# Patient Record
Sex: Male | Born: 1975 | Race: White | Hispanic: No | Smoking: Current every day smoker
Health system: Southern US, Community
[De-identification: ages and names within clinical notes are randomized; demographics above are authoritative.]

## PROBLEM LIST (undated history)

## (undated) DIAGNOSIS — G8929 Other chronic pain: Secondary | ICD-10-CM

## (undated) DIAGNOSIS — F410 Panic disorder [episodic paroxysmal anxiety] without agoraphobia: Secondary | ICD-10-CM

## (undated) DIAGNOSIS — M549 Dorsalgia, unspecified: Secondary | ICD-10-CM

## (undated) DIAGNOSIS — R569 Unspecified convulsions: Secondary | ICD-10-CM

## (undated) SURGERY — VIDEO BRONCHOSCOPY WITHOUT FLUORO
Anesthesia: Moderate Sedation

---

## 2006-03-29 ENCOUNTER — Emergency Department: Payer: Self-pay | Admitting: Emergency Medicine

## 2006-10-19 ENCOUNTER — Emergency Department: Payer: Self-pay

## 2006-11-10 ENCOUNTER — Emergency Department: Payer: Self-pay | Admitting: Emergency Medicine

## 2006-11-10 ENCOUNTER — Other Ambulatory Visit: Payer: Self-pay

## 2007-01-10 ENCOUNTER — Emergency Department: Payer: Self-pay | Admitting: Emergency Medicine

## 2007-07-14 ENCOUNTER — Emergency Department: Payer: Self-pay

## 2007-08-13 ENCOUNTER — Emergency Department: Payer: Self-pay | Admitting: Emergency Medicine

## 2007-12-27 ENCOUNTER — Emergency Department: Payer: Self-pay | Admitting: Emergency Medicine

## 2008-02-11 ENCOUNTER — Other Ambulatory Visit: Payer: Self-pay

## 2008-02-11 ENCOUNTER — Emergency Department: Payer: Self-pay

## 2008-02-15 ENCOUNTER — Other Ambulatory Visit: Payer: Self-pay

## 2008-02-15 ENCOUNTER — Emergency Department: Payer: Self-pay | Admitting: Emergency Medicine

## 2008-03-08 ENCOUNTER — Emergency Department: Payer: Self-pay | Admitting: Emergency Medicine

## 2008-03-09 ENCOUNTER — Other Ambulatory Visit: Payer: Self-pay

## 2011-04-18 ENCOUNTER — Emergency Department: Payer: Self-pay | Admitting: *Deleted

## 2011-08-14 ENCOUNTER — Emergency Department: Payer: Self-pay | Admitting: Emergency Medicine

## 2011-10-31 ENCOUNTER — Inpatient Hospital Stay: Payer: Self-pay | Admitting: Internal Medicine

## 2012-03-04 ENCOUNTER — Emergency Department: Payer: Self-pay | Admitting: Emergency Medicine

## 2012-03-04 LAB — CBC
HCT: 48.3 % (ref 40.0–52.0)
HGB: 16.2 g/dL (ref 13.0–18.0)
MCH: 27.8 pg (ref 26.0–34.0)
Platelet: 258 10*3/uL (ref 150–440)
RBC: 5.84 10*6/uL (ref 4.40–5.90)
WBC: 7.1 10*3/uL (ref 3.8–10.6)

## 2012-03-04 LAB — COMPREHENSIVE METABOLIC PANEL
Albumin: 3.8 g/dL (ref 3.4–5.0)
Anion Gap: 13 (ref 7–16)
Bilirubin,Total: 0.3 mg/dL (ref 0.2–1.0)
Glucose: 96 mg/dL (ref 65–99)
Osmolality: 281 (ref 275–301)
Potassium: 3.9 mmol/L (ref 3.5–5.1)
SGOT(AST): 29 U/L (ref 15–37)
Sodium: 142 mmol/L (ref 136–145)
Total Protein: 7.6 g/dL (ref 6.4–8.2)

## 2012-03-04 LAB — TROPONIN I: Troponin-I: <0.02 ng/mL

## 2012-03-06 ENCOUNTER — Emergency Department: Payer: Self-pay | Admitting: Emergency Medicine

## 2012-03-06 LAB — URINALYSIS, COMPLETE
Bacteria: NONE SEEN
Blood: NEGATIVE
Nitrite: NEGATIVE
Ph: 6 (ref 4.5–8.0)
Specific Gravity: 1.018 (ref 1.003–1.030)
Squamous Epithelial: NONE SEEN
WBC UR: 1 /HPF (ref 0–5)

## 2012-03-06 LAB — COMPREHENSIVE METABOLIC PANEL
Albumin: 3.4 g/dL (ref 3.4–5.0)
Alkaline Phosphatase: 83 U/L (ref 50–136)
Bilirubin,Total: 0.7 mg/dL (ref 0.2–1.0)
Calcium, Total: 8.5 mg/dL (ref 8.5–10.1)
Chloride: 101 mmol/L (ref 98–107)
EGFR (African American): >60
Osmolality: 276 (ref 275–301)
Potassium: 4.4 mmol/L (ref 3.5–5.1)
Sodium: 139 mmol/L (ref 136–145)
Total Protein: 7.5 g/dL (ref 6.4–8.2)

## 2012-03-06 LAB — CBC
HCT: 50 % (ref 40.0–52.0)
MCHC: 33.1 g/dL (ref 32.0–36.0)
Platelet: 218 10*3/uL (ref 150–440)
RBC: 6.02 10*6/uL — ABNORMAL HIGH (ref 4.40–5.90)
RDW: 15.8 % — ABNORMAL HIGH (ref 11.5–14.5)
WBC: 14.2 10*3/uL — ABNORMAL HIGH (ref 3.8–10.6)

## 2012-03-06 LAB — TSH: Thyroid Stimulating Horm: 0.54 u[IU]/mL

## 2012-03-06 LAB — PROTIME-INR
INR: 1
Prothrombin Time: 13.7 secs (ref 11.5–14.7)

## 2012-03-06 LAB — RAPID INFLUENZA A&B ANTIGENS

## 2012-03-06 LAB — TROPONIN I: Troponin-I: <0.02 ng/mL

## 2012-03-12 LAB — CULTURE, BLOOD (SINGLE)

## 2012-03-15 ENCOUNTER — Inpatient Hospital Stay: Payer: Self-pay | Admitting: Psychiatry

## 2012-03-15 LAB — URINALYSIS, COMPLETE
Bacteria: NONE SEEN
Bilirubin,UR: NEGATIVE
Glucose,UR: NEGATIVE mg/dL (ref 0–75)
Ketone: NEGATIVE
Leukocyte Esterase: NEGATIVE
Ph: 7 (ref 4.5–8.0)
RBC,UR: NONE SEEN /HPF (ref 0–5)
Squamous Epithelial: NONE SEEN
WBC UR: <1 /HPF (ref 0–5)

## 2012-03-15 LAB — COMPREHENSIVE METABOLIC PANEL
Alkaline Phosphatase: 83 U/L (ref 50–136)
Anion Gap: 13 (ref 7–16)
BUN: 9 mg/dL (ref 7–18)
Bilirubin,Total: 0.4 mg/dL (ref 0.2–1.0)
Calcium, Total: 9.3 mg/dL (ref 8.5–10.1)
Chloride: 106 mmol/L (ref 98–107)
Co2: 23 mmol/L (ref 21–32)
EGFR (Non-African Amer.): >60
Osmolality: 281 (ref 275–301)
Potassium: 4 mmol/L (ref 3.5–5.1)
SGOT(AST): 68 U/L — ABNORMAL HIGH (ref 15–37)
Total Protein: 8.1 g/dL (ref 6.4–8.2)

## 2012-03-15 LAB — CBC
MCV: 83 fL (ref 80–100)
Platelet: 239 10*3/uL (ref 150–440)
RDW: 15.2 % — ABNORMAL HIGH (ref 11.5–14.5)
WBC: 7.7 10*3/uL (ref 3.8–10.6)

## 2012-03-15 LAB — DRUG SCREEN, URINE
Barbiturates, Ur Screen: NEGATIVE (ref ?–200)
Cannabinoid 50 Ng, Ur ~~LOC~~: NEGATIVE (ref ?–50)
Cocaine Metabolite,Ur ~~LOC~~: NEGATIVE (ref ?–300)
Methadone, Ur Screen: NEGATIVE (ref ?–300)
Opiate, Ur Screen: NEGATIVE (ref ?–300)
Phencyclidine (PCP) Ur S: NEGATIVE (ref ?–25)
Tricyclic, Ur Screen: NEGATIVE (ref ?–1000)

## 2012-03-15 LAB — ACETAMINOPHEN LEVEL: Acetaminophen: <2 ug/mL

## 2012-03-15 LAB — TSH: Thyroid Stimulating Horm: 0.68 u[IU]/mL

## 2012-05-09 ENCOUNTER — Emergency Department: Payer: Self-pay | Admitting: *Deleted

## 2012-06-13 ENCOUNTER — Emergency Department: Payer: Self-pay | Admitting: Emergency Medicine

## 2012-06-13 LAB — BASIC METABOLIC PANEL WITH GFR
Anion Gap: 9
BUN: 8 mg/dL
Calcium, Total: 9 mg/dL
Chloride: 106 mmol/L
Co2: 26 mmol/L
Creatinine: 0.95 mg/dL
EGFR (African American): >60
EGFR (Non-African Amer.): >60
Glucose: 105 mg/dL — ABNORMAL HIGH
Osmolality: 280
Potassium: 3.2 mmol/L — ABNORMAL LOW
Sodium: 141 mmol/L

## 2012-06-13 LAB — URINALYSIS, COMPLETE
Bacteria: NONE SEEN
Glucose,UR: 50 mg/dL (ref 0–75)
Granular Cast: 28
Nitrite: NEGATIVE
Protein: 100
RBC,UR: <1 /HPF (ref 0–5)
Specific Gravity: 1.015 (ref 1.003–1.030)
Squamous Epithelial: NONE SEEN
WBC UR: <1 /HPF (ref 0–5)

## 2012-06-13 LAB — CK: CK, Total: 241 U/L — ABNORMAL HIGH (ref 35–232)

## 2012-06-13 LAB — DRUG SCREEN, URINE
Amphetamines, Ur Screen: NEGATIVE
Barbiturates, Ur Screen: NEGATIVE
Benzodiazepine, Ur Scrn: POSITIVE
Cannabinoid 50 Ng, Ur ~~LOC~~: NEGATIVE
Cocaine Metabolite,Ur ~~LOC~~: NEGATIVE
MDMA (Ecstasy)Ur Screen: NEGATIVE
Methadone, Ur Screen: NEGATIVE
Opiate, Ur Screen: NEGATIVE
Phencyclidine (PCP) Ur S: NEGATIVE
Tricyclic, Ur Screen: NEGATIVE

## 2012-06-13 LAB — CBC WITH DIFFERENTIAL/PLATELET
Eosinophil #: 0 10*3/uL (ref 0.0–0.7)
Eosinophil %: 0.1 %
HGB: 14.4 g/dL (ref 13.0–18.0)
Lymphocyte %: 7.1 %
MCH: 28.8 pg (ref 26.0–34.0)
MCHC: 33.6 g/dL (ref 32.0–36.0)
MCV: 86 fL (ref 80–100)
Monocyte #: 1 x10 3/mm (ref 0.2–1.0)
Neutrophil %: 86.1 %
Platelet: 247 10*3/uL (ref 150–440)
RDW: 17.2 % — ABNORMAL HIGH (ref 11.5–14.5)
WBC: 15.2 10*3/uL — ABNORMAL HIGH (ref 3.8–10.6)

## 2012-06-13 LAB — ETHANOL
Ethanol %: <0.003 %
Ethanol: <3 mg/dL

## 2012-06-14 LAB — CBC WITH DIFFERENTIAL/PLATELET
Basophil #: 0 10*3/uL (ref 0.0–0.1)
Basophil %: 0.3 %
Eosinophil %: 0.1 %
HGB: 13.9 g/dL (ref 13.0–18.0)
Lymphocyte #: 1.8 10*3/uL (ref 1.0–3.6)
Lymphocyte %: 14.8 %
MCH: 28.2 pg (ref 26.0–34.0)
MCV: 86 fL (ref 80–100)
Neutrophil #: 9.6 10*3/uL — ABNORMAL HIGH (ref 1.4–6.5)
Neutrophil %: 78.4 %
Platelet: 236 10*3/uL (ref 150–440)
RDW: 17.5 % — ABNORMAL HIGH (ref 11.5–14.5)

## 2012-09-06 ENCOUNTER — Emergency Department: Payer: Self-pay | Admitting: Emergency Medicine

## 2013-07-29 ENCOUNTER — Ambulatory Visit (INDEPENDENT_AMBULATORY_CARE_PROVIDER_SITE_OTHER): Payer: BC Managed Care – PPO | Admitting: Emergency Medicine

## 2013-07-29 ENCOUNTER — Ambulatory Visit: Payer: BC Managed Care – PPO

## 2013-07-29 VITALS — BP 118/72 | HR 90 | Temp 98.2°F | Resp 17 | Ht 69.5 in | Wt 174.0 lb

## 2013-07-29 DIAGNOSIS — M4120 Other idiopathic scoliosis, site unspecified: Secondary | ICD-10-CM

## 2013-07-29 DIAGNOSIS — M545 Low back pain: Secondary | ICD-10-CM

## 2013-07-29 DIAGNOSIS — M419 Scoliosis, unspecified: Secondary | ICD-10-CM

## 2013-07-29 DIAGNOSIS — M549 Dorsalgia, unspecified: Secondary | ICD-10-CM

## 2013-07-29 DIAGNOSIS — M546 Pain in thoracic spine: Secondary | ICD-10-CM

## 2013-07-29 LAB — POCT URINALYSIS DIPSTICK
Bilirubin, UA: NEGATIVE
Blood, UA: NEGATIVE
Glucose, UA: NEGATIVE
Ketones, UA: NEGATIVE
Leukocytes, UA: NEGATIVE
Nitrite, UA: NEGATIVE
pH, UA: 7.5

## 2013-07-29 MED ORDER — OXYCODONE-ACETAMINOPHEN 5-325 MG PO TABS: 1.0000 | ORAL_TABLET | Freq: Three times a day (TID) | ORAL | Status: DC | PRN

## 2013-07-29 MED ORDER — CYCLOBENZAPRINE HCL 10 MG PO TABS: ORAL_TABLET | ORAL | Status: DC

## 2013-07-29 NOTE — Patient Instructions (Signed)
Please recheck in 48-72 hours to be sure you are improving. He should hear from our office regarding the scheduling of an MRI of the T-spine and LS-spine
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
## 2013-07-30 ENCOUNTER — Ambulatory Visit (HOSPITAL_COMMUNITY)
Admission: RE | Admit: 2013-07-30 | Discharge: 2013-07-30 | Disposition: A | Discharge status: Home / Self Care | Payer: BC Managed Care – PPO | Source: Ambulatory Visit | Attending: Emergency Medicine | Admitting: Emergency Medicine

## 2013-07-30 ENCOUNTER — Other Ambulatory Visit: Payer: Self-pay | Admitting: Emergency Medicine

## 2013-07-30 DIAGNOSIS — M5126 Other intervertebral disc displacement, lumbar region: Secondary | ICD-10-CM

## 2013-07-30 DIAGNOSIS — M549 Dorsalgia, unspecified: Secondary | ICD-10-CM | POA: Insufficient documentation

## 2013-07-30 DIAGNOSIS — M5124 Other intervertebral disc displacement, thoracic region: Secondary | ICD-10-CM

## 2013-07-30 DIAGNOSIS — M4120 Other idiopathic scoliosis, site unspecified: Secondary | ICD-10-CM | POA: Insufficient documentation

## 2013-07-31 ENCOUNTER — Ambulatory Visit (INDEPENDENT_AMBULATORY_CARE_PROVIDER_SITE_OTHER): Payer: BC Managed Care – PPO | Admitting: Emergency Medicine

## 2013-07-31 VITALS — BP 142/92 | HR 86 | Temp 98.0°F | Resp 16 | Ht 70.0 in | Wt 180.0 lb

## 2013-07-31 DIAGNOSIS — M546 Pain in thoracic spine: Secondary | ICD-10-CM

## 2013-07-31 DIAGNOSIS — M545 Low back pain: Secondary | ICD-10-CM

## 2013-07-31 MED ORDER — OXYCODONE-ACETAMINOPHEN 10-325 MG PO TABS: 1.0000 | ORAL_TABLET | Freq: Four times a day (QID) | ORAL | Status: DC | PRN
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
## 2013-07-31 NOTE — Patient Instructions (Signed)
You should be contacted by our office the first of the week regarding followup with an orthopedist in [place] so they can continue your management of your back injury.

## 2013-08-03 ENCOUNTER — Other Ambulatory Visit: Payer: Self-pay

## 2013-08-03 ENCOUNTER — Encounter: Payer: Self-pay | Admitting: Radiology

## 2013-08-06 ENCOUNTER — Ambulatory Visit (INDEPENDENT_AMBULATORY_CARE_PROVIDER_SITE_OTHER): Payer: BC Managed Care – PPO | Admitting: Emergency Medicine

## 2013-08-06 ENCOUNTER — Ambulatory Visit: Payer: BC Managed Care – PPO

## 2013-08-06 ENCOUNTER — Telehealth: Payer: Self-pay

## 2013-08-06 VITALS — BP 130/78 | HR 93 | Temp 98.3°F | Resp 18 | Ht 70.5 in | Wt 174.6 lb

## 2013-08-06 DIAGNOSIS — M545 Low back pain: Secondary | ICD-10-CM

## 2013-08-06 DIAGNOSIS — M542 Cervicalgia: Secondary | ICD-10-CM

## 2013-08-06 DIAGNOSIS — M546 Pain in thoracic spine: Secondary | ICD-10-CM

## 2013-08-06 MED ORDER — OXYCODONE-ACETAMINOPHEN 10-325 MG PO TABS: 1.0000 | ORAL_TABLET | Freq: Four times a day (QID) | ORAL | Status: DC | PRN

## 2013-08-06 MED ORDER — CYCLOBENZAPRINE HCL 10 MG PO TABS: ORAL_TABLET | ORAL | Status: DC

## 2013-08-06 NOTE — Telephone Encounter (Signed)
Patient forgot to get note from Dr Cleta Alberts about being out of work and how long Dr Cleta Alberts wants him out. Should he be out until he sees the ortho?

## 2013-08-06 NOTE — Telephone Encounter (Signed)
Yes patient needs this day out of work until he is seen by the orthopedist. He needs to be sure and get the note from me before I leave to go at a town. It is fine to give him a note out of work until his orthopedic evaluation.

## 2013-08-06 NOTE — Patient Instructions (Addendum)
You should be contacted by our workers compensation department on Monday or Tuesday regarding your appointment with the orthopedist

## 2013-08-06 NOTE — Progress Notes (Signed)
  Subjective:    Patient ID: Paulla Fore, male    DOB: 04-02-1976, 37 y.o.   MRN: [id number]  HPI 37 y.o male presents to clinic today for recheck of back pain. Appointment with orthopedics is in 2 weeks.   Taking flexeril at night. Denies any numbness in legs or trouble urinating. He still has significant pain it primarily in his mid thoracic area but also down in the lower LS-spine area. He denies radicular symptoms down his right leg.   Review of Systems     Objective:   Physical Exam patient appears very uncomfortable. He has to sit upright braced in a chair. Examination of the neck reveals no cervical spine tenderness there is no pericervical tenderness and he has good rotation of the neck. There is significant tenderness over the midthoracic spine and also significant tenderness in the L5-S1 area. Deep tendon reflexes biceps and brachial radialis are 2+ knees and ankles are 2+. He has positive straight leg raising at 75 both legs.  [facility] reading (PRIMARY) by  Dr.Dorraine Ellender there is an minimal C5-C6  degenerative disc disease no acute changes        Assessment & Plan:    Meds were refilled. Referral made to worker's comp for patient be seen for a lumbar thoracic strain.

## 2013-08-07 ENCOUNTER — Telehealth: Payer: Self-pay

## 2013-08-07 ENCOUNTER — Encounter: Payer: Self-pay | Admitting: *Deleted

## 2013-08-07 NOTE — Telephone Encounter (Signed)
Created note- pt aware and will pick up Sunday

## 2013-08-07 NOTE — Telephone Encounter (Signed)
Pt was here yesterday and saw Daub, could not sleep last night, had muscle spasms and cramps on the right side.  Would like a muscle relaxer or something called in if possible.  Call back [phone].

## 2013-08-08 NOTE — Telephone Encounter (Signed)
Pt states that he has been taking the flexeril only at night but he still cannot sleep.  Is there anything we can do to help.

## 2013-08-09 MED ORDER — METAXALONE 800 MG PO TABS: 800.0000 mg | ORAL_TABLET | Freq: Three times a day (TID) | ORAL | Status: DC

## 2013-08-09 NOTE — Telephone Encounter (Signed)
Called patient to advise this is sent in for him. Advised also the work note is at front desk.

## 2013-08-09 NOTE — Telephone Encounter (Signed)
We can try and give him Skelaxin at night to see if he can sleep. We can call in the 800 mg tablets to take one at bedtime. He cannot take this with the Flexeril. Call-in #20 tablets no refill

## 2013-09-22 ENCOUNTER — Emergency Department (HOSPITAL_COMMUNITY)
Admission: EM | Admit: 2013-09-22 | Discharge: 2013-09-22 | Disposition: A | Discharge status: Home / Self Care | Payer: BC Managed Care – PPO | Attending: Emergency Medicine | Admitting: Emergency Medicine

## 2013-09-22 ENCOUNTER — Encounter (HOSPITAL_COMMUNITY): Payer: Self-pay | Admitting: Emergency Medicine

## 2013-09-22 ENCOUNTER — Emergency Department (HOSPITAL_COMMUNITY): Payer: BC Managed Care – PPO

## 2013-09-22 DIAGNOSIS — J159 Unspecified bacterial pneumonia: Secondary | ICD-10-CM | POA: Insufficient documentation

## 2013-09-22 DIAGNOSIS — I10 Essential (primary) hypertension: Secondary | ICD-10-CM | POA: Insufficient documentation

## 2013-09-22 DIAGNOSIS — F17200 Nicotine dependence, unspecified, uncomplicated: Secondary | ICD-10-CM | POA: Insufficient documentation

## 2013-09-22 DIAGNOSIS — G8929 Other chronic pain: Secondary | ICD-10-CM | POA: Insufficient documentation

## 2013-09-22 DIAGNOSIS — J189 Pneumonia, unspecified organism: Secondary | ICD-10-CM

## 2013-09-22 DIAGNOSIS — F410 Panic disorder [episodic paroxysmal anxiety] without agoraphobia: Secondary | ICD-10-CM | POA: Insufficient documentation

## 2013-09-22 DIAGNOSIS — R112 Nausea with vomiting, unspecified: Secondary | ICD-10-CM | POA: Insufficient documentation

## 2013-09-22 DIAGNOSIS — R040 Epistaxis: Secondary | ICD-10-CM | POA: Insufficient documentation

## 2013-09-22 DIAGNOSIS — R51 Headache: Secondary | ICD-10-CM | POA: Insufficient documentation

## 2013-09-22 DIAGNOSIS — J029 Acute pharyngitis, unspecified: Secondary | ICD-10-CM | POA: Insufficient documentation

## 2013-09-22 DIAGNOSIS — Z79899 Other long term (current) drug therapy: Secondary | ICD-10-CM | POA: Insufficient documentation

## 2013-09-22 HISTORY — DX: Unspecified convulsions: R56.9

## 2013-09-22 HISTORY — DX: Panic disorder (episodic paroxysmal anxiety): F41.0

## 2013-09-22 HISTORY — DX: Other chronic pain: G89.29

## 2013-09-22 HISTORY — DX: Dorsalgia, unspecified: M54.9

## 2013-09-22 LAB — CBC WITH DIFFERENTIAL/PLATELET
Basophils Absolute: 0 K/uL (ref 0.0–0.1)
Basophils Relative: 0 % (ref 0–1)
Eosinophils Absolute: 0.2 K/uL (ref 0.0–0.7)
Eosinophils Relative: 2 % (ref 0–5)
HCT: 41.5 % (ref 39.0–52.0)
Hemoglobin: 15 g/dL (ref 13.0–17.0)
Lymphocytes Relative: 20 % (ref 12–46)
Lymphs Abs: 1.9 K/uL (ref 0.7–4.0)
MCH: 30.1 pg (ref 26.0–34.0)
MCHC: 36.1 g/dL — ABNORMAL HIGH (ref 30.0–36.0)
MCV: 83.2 fL (ref 78.0–100.0)
Monocytes Absolute: 0.9 K/uL (ref 0.1–1.0)
Monocytes Relative: 10 % (ref 3–12)
Neutro Abs: 6.4 K/uL (ref 1.7–7.7)
Neutrophils Relative %: 68 % (ref 43–77)
Platelets: 163 K/uL (ref 150–400)
RBC: 4.99 MIL/uL (ref 4.22–5.81)
RDW: 13.8 % (ref 11.5–15.5)
WBC: 9.4 K/uL (ref 4.0–10.5)

## 2013-09-22 LAB — COMPREHENSIVE METABOLIC PANEL WITH GFR
ALT: 12 U/L (ref 0–53)
AST: 18 U/L (ref 0–37)
Albumin: 4.3 g/dL (ref 3.5–5.2)
Alkaline Phosphatase: 75 U/L (ref 39–117)
BUN: 4 mg/dL — ABNORMAL LOW (ref 6–23)
CO2: 26 meq/L (ref 19–32)
Calcium: 8.8 mg/dL (ref 8.4–10.5)
Chloride: 105 meq/L (ref 96–112)
Creatinine, Ser: 0.82 mg/dL (ref 0.50–1.35)
GFR calc Af Amer: >90 mL/min
GFR calc non Af Amer: >90 mL/min
Glucose, Bld: 62 mg/dL — ABNORMAL LOW (ref 70–99)
Potassium: 3.3 meq/L — ABNORMAL LOW (ref 3.5–5.1)
Sodium: 142 meq/L (ref 135–145)
Total Bilirubin: 0.3 mg/dL (ref 0.3–1.2)
Total Protein: 6.3 g/dL (ref 6.0–8.3)

## 2013-09-22 LAB — LIPASE, BLOOD: Lipase: 71 U/L — ABNORMAL HIGH (ref 11–59)

## 2013-09-22 MED ORDER — SODIUM CHLORIDE 0.9 % IV BOLUS (SEPSIS)
1000.0000 mL | Freq: Once | INTRAVENOUS | Status: AC
  Administered 2013-09-22: 1000 mL via INTRAVENOUS

## 2013-09-22 MED ORDER — HYDROCOD POLST-CHLORPHEN POLST 10-8 MG/5ML PO LQCR
5.0000 mL | Freq: Once | ORAL | Status: AC
  Administered 2013-09-22: 5 mL via ORAL
  Filled 2013-09-22: qty 5

## 2013-09-22 MED ORDER — HYDROCOD POLST-CHLORPHEN POLST 10-8 MG/5ML PO LQCR: 5.0000 mL | Freq: Two times a day (BID) | ORAL | Status: DC | PRN

## 2013-09-22 MED ORDER — ONDANSETRON HCL 4 MG/2ML IJ SOLN
4.0000 mg | Freq: Once | INTRAMUSCULAR | Status: AC
  Administered 2013-09-22: 4 mg via INTRAVENOUS
  Filled 2013-09-22: qty 2

## 2013-09-22 MED ORDER — LEVOFLOXACIN 750 MG PO TABS: 750.0000 mg | ORAL_TABLET | Freq: Every day | ORAL | Status: DC

## 2013-09-22 MED ORDER — LEVOFLOXACIN 750 MG PO TABS
750.0000 mg | ORAL_TABLET | Freq: Once | ORAL | Status: AC
  Administered 2013-09-22: 750 mg via ORAL
  Filled 2013-09-22: qty 1

## 2013-09-22 NOTE — ED Notes (Signed)
Pt. reports epistaxis onset this evening , fever yesterday , seen at an urgent care yesterday prescribed with Amoxicillin antibiotic .

## 2013-09-22 NOTE — ED Provider Notes (Signed)
CSN: [id number]     Arrival date & time 09/22/13  0136 History   First MD Initiated Contact with Patient 09/22/13 0141     Chief Complaint  Patient presents with  . Epistaxis  . Fever   (Consider location/radiation/quality/duration/timing/severity/associated sxs/prior Treatment) HPI Comments: Patient presents to the ER for evaluation of multiple complaints. Patient reports that he started having a low-grade fever yesterday. He also awakened with a sore throat. He was seen by his primary care doctor and had a negative rapid strep. He was started on amoxicillin and Robitussin. Patient reports that this evening after starting the medications, he had onset of nausea, vomiting. He says it felt like his throat was swollen he had trouble pushing. She reports a "funny feeling" on the left side of his head.after he vomited he had onset of nosebleed. Wife reports that there was "a lot of blood".  Patient is a 37 y.o. male presenting with nosebleeds and fever.  Epistaxis Associated symptoms: fever, headaches and sore throat   Fever Associated symptoms: headaches, nausea, sore throat and vomiting     Past Medical History  Diagnosis Date  . Hypertension   . Seizures   . Panic attack   . Chronic back pain    History reviewed. No pertinent past surgical history. No family history on file. History  Substance Use Topics  . Smoking status: Current Every Day Smoker -- 1.00 packs/day for 14 years    Types: Cigarettes  . Smokeless tobacco: Not on file  . Alcohol Use: No    Review of Systems  Constitutional: Positive for fever.  HENT: Positive for nosebleeds and sore throat.   Respiratory: Positive for shortness of breath.   Gastrointestinal: Positive for nausea and vomiting.  Neurological: Positive for headaches.  All other systems reviewed and are negative.    Allergies  Review of patient's allergies indicates no known allergies.  Home Medications   Current Outpatient Rx  Name  Route   Sig  Dispense  Refill  . acetaminophen (TYLENOL) 500 MG tablet   Oral   Take 500 mg by mouth every 6 (six) hours as needed for pain.         Marland Kitchen ALPRAZolam (XANAX) 1 MG tablet   Oral   Take 1 mg by mouth at bedtime as needed for sleep.         Marland Kitchen amoxicillin (AMOXIL) 875 MG tablet   Oral   Take 875 mg by mouth 2 (two) times daily. For ten days         . guaiFENesin-codeine (ROBITUSSIN AC) 100-10 MG/5ML syrup   Oral   Take 5 mLs by mouth at bedtime.         . naproxen sodium (ANAPROX) 220 MG tablet   Oral   Take 440 mg by mouth as needed (fever).         . traZODone (DESYREL) 100 MG tablet   Oral   Take 200 mg by mouth at bedtime.          BP 123/81  Pulse 83  Temp(Src) 98.1 F (36.7 C) (Oral)  Resp 10  SpO2 98% Physical Exam  Constitutional: He is oriented to person, place, and time. He appears well-developed and well-nourished. No distress.  HENT:  Head: Normocephalic and atraumatic.  Right Ear: Hearing normal.  Left Ear: Hearing normal.  Nose: Nose normal.  Mouth/Throat: Oropharynx is clear and moist and mucous membranes are normal.  Eyes: Conjunctivae and EOM are normal. Pupils are equal, round,  and reactive to light.  Neck: Normal range of motion. Neck supple.  Cardiovascular: Regular rhythm, S1 normal and S2 normal.  Exam reveals no gallop and no friction rub.   No murmur heard. Pulmonary/Chest: Effort normal and breath sounds normal. No respiratory distress. He exhibits no tenderness.  Abdominal: Soft. Normal appearance and bowel sounds are normal. There is no hepatosplenomegaly. There is no tenderness. There is no rebound, no guarding, no tenderness at McBurney's point and negative Murphy's sign. No hernia.  Musculoskeletal: Normal range of motion.  Neurological: He is alert and oriented to person, place, and time. He has normal strength. No cranial nerve deficit or sensory deficit. Coordination normal. GCS eye subscore is 4. GCS verbal subscore is 5.  GCS motor subscore is 6.  Skin: Skin is warm, dry and intact. No rash noted. No cyanosis.  Psychiatric: He has a normal mood and affect. His speech is normal and behavior is normal. Thought content normal.    ED Course  Procedures (including critical care time) Labs Review Labs Reviewed  CBC WITH DIFFERENTIAL - Abnormal; Notable for the following:    MCHC 36.1 (*)    All other components within normal limits  COMPREHENSIVE METABOLIC PANEL  LIPASE, BLOOD  URINALYSIS, ROUTINE W REFLEX MICROSCOPIC   Imaging Review Dg Chest 2 View  09/22/2013   CLINICAL DATA:  Shortness of breath and fever  EXAM: CHEST  2 VIEW  COMPARISON:  None.  FINDINGS: Very small ill-defined nodular density in the peripheral left upper chest, approximately 2 cm. No lobar consolidation. Biapical pleural parenchymal scarring which is symmetric. No cardiomegaly. No effusion or pneumothorax.  IMPRESSION: Small opacity in the peripheral left lung, which may represent an early infectious infiltrate given the history. Recommend followup examination after treatment to ensure clearing.   Electronically Signed   By: Tiburcio Pea   On: 09/22/2013 02:33    MDM  Diagnosis: Community acquired pneumonia  Patient has been sick since yesterday with fever, sore throat, congestion and cough. Blood work was unremarkable. He had an episode of epistaxis earlier, platelets are normal as is hemoglobin. No current bleeding. Chest x-ray does show early pneumonia which explains the patient's fever and cough. Will be switched from amoxicillin to Levaquin.    Gilda Crease, MD 09/22/13 [phone]

## 2013-09-22 NOTE — ED Notes (Signed)
Pt was seen at [facility] yesterday with c/o of sore throat.  Pt given Amoxicillin and Cheratussin Rx.  Pt now presents with c/o continued throat pain, N/V, headache, fever, and nasal bleeding.  Pt currently afebrile, states he took Acetaminophen approximately 2 hours ago.

## 2013-09-27 ENCOUNTER — Ambulatory Visit (INDEPENDENT_AMBULATORY_CARE_PROVIDER_SITE_OTHER): Payer: BC Managed Care – PPO | Admitting: Family Medicine

## 2013-09-27 ENCOUNTER — Ambulatory Visit: Payer: BC Managed Care – PPO

## 2013-09-27 VITALS — BP 160/92 | HR 86 | Temp 98.2°F | Resp 16 | Ht 70.5 in | Wt 163.0 lb

## 2013-09-27 DIAGNOSIS — Z720 Tobacco use: Secondary | ICD-10-CM

## 2013-09-27 DIAGNOSIS — J189 Pneumonia, unspecified organism: Secondary | ICD-10-CM

## 2013-09-27 LAB — POCT CBC
Hemoglobin: 14.8 g/dL (ref 14.1–18.1)
Lymph, poc: 3 (ref 0.6–3.4)
MCHC: 31.6 g/dL — AB (ref 31.8–35.4)
MID (cbc): 1.2 — AB (ref 0–0.9)
MPV: 10.1 fL (ref 0–99.8)
POC Granulocyte: 4.5 (ref 2–6.9)
POC LYMPH PERCENT: 34.4 %L (ref 10–50)
POC MID %: 14.2 %M — AB (ref 0–12)
Platelet Count, POC: 148 10*3/uL (ref 142–424)
RDW, POC: 15.7 %

## 2013-09-27 MED ORDER — ALBUTEROL SULFATE (2.5 MG/3ML) 0.083% IN NEBU
2.5000 mg | INHALATION_SOLUTION | Freq: Once | RESPIRATORY_TRACT | Status: AC
  Administered 2013-09-27: 2.5 mg via RESPIRATORY_TRACT

## 2013-09-27 MED ORDER — IPRATROPIUM BROMIDE 0.02 % IN SOLN
0.5000 mg | Freq: Once | RESPIRATORY_TRACT | Status: AC
  Administered 2013-09-27: 0.5 mg via RESPIRATORY_TRACT

## 2013-09-27 MED ORDER — HYDROCOD POLST-CHLORPHEN POLST 10-8 MG/5ML PO LQCR: 5.0000 mL | Freq: Two times a day (BID) | ORAL | Status: DC | PRN

## 2013-09-27 MED ORDER — PREDNISONE 20 MG PO TABS: 40.0000 mg | ORAL_TABLET | Freq: Every day | ORAL | Status: DC

## 2013-09-27 MED ORDER — IPRATROPIUM-ALBUTEROL 20-100 MCG/ACT IN AERS: 1.0000 | INHALATION_SPRAY | Freq: Four times a day (QID) | RESPIRATORY_TRACT | Status: DC | PRN
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
## 2013-09-28 ENCOUNTER — Telehealth: Payer: Self-pay

## 2013-09-28 MED ORDER — IPRATROPIUM-ALBUTEROL 20-100 MCG/ACT IN AERS: 1.0000 | INHALATION_SPRAY | Freq: Four times a day (QID) | RESPIRATORY_TRACT | Status: DC | PRN

## 2013-09-28 NOTE — Telephone Encounter (Signed)
Called again they did not get the combivent, it was resent. Dr Clelia Croft, I sent to you in error, you do not need to do anything with this.

## 2013-09-28 NOTE — Telephone Encounter (Signed)
Called pharmacy, to make sure they got combivent inhaler, left message to call me back.

## 2013-09-28 NOTE — Telephone Encounter (Signed)
Patient called to say that his pharmacy at [facility] received all medications except for albuterol. - inhaler.    [phone]  Pharmacy: [id number] - [place], [place] - [address] AT [facility] OF [address]

## 2013-10-07 ENCOUNTER — Ambulatory Visit: Payer: Self-pay

## 2013-10-07 ENCOUNTER — Ambulatory Visit: Payer: BC Managed Care – PPO

## 2013-10-07 ENCOUNTER — Ambulatory Visit (INDEPENDENT_AMBULATORY_CARE_PROVIDER_SITE_OTHER): Payer: BC Managed Care – PPO | Admitting: Emergency Medicine

## 2013-10-07 VITALS — BP 104/72 | HR 112 | Temp 99.1°F | Resp 18 | Ht 69.5 in | Wt 156.8 lb

## 2013-10-07 DIAGNOSIS — M542 Cervicalgia: Secondary | ICD-10-CM

## 2013-10-07 DIAGNOSIS — W19XXXA Unspecified fall, initial encounter: Secondary | ICD-10-CM

## 2013-10-07 DIAGNOSIS — M549 Dorsalgia, unspecified: Secondary | ICD-10-CM

## 2013-10-07 MED ORDER — OXYCODONE-ACETAMINOPHEN 5-325 MG PO TABS: 1.0000 | ORAL_TABLET | Freq: Three times a day (TID) | ORAL | Status: DC | PRN

## 2013-10-07 NOTE — Progress Notes (Addendum)
[address]   [place], [place]  [address]   [phone] Subjective:    Patient ID: Alexis West, male    DOB: 1976-09-29, 37 y.o.   MRN: [id number]  This chart was scribed for Lesle Chris, MD by Blanchard Kelch, ED Scribe. The patient was seen in room 13. Patient's care was started at 2:21 PM.   HPI  Alexis West is a 37 y.o. male who presents to office due to back pain that began after he fell off a roof yesterday afternoon about 5 PM. He was cleaning the gutters and slipped on the corner of the roof. He fell about 11-12 feet and landed on his upper back on the grass. He denies hitting his head or loss of consciousness. He currently has pain to his mid upper back and neck.  He is also complaining of left leg pain but this was baseline before the accident. He has a history of chronic lower back pain. He was seen by an orthopedist for the chronic pain. He has been to physical therapy and has since returned to work about three weeks ago. He works as a Civil engineer, contracting. He was recently diagnosed with pneumonia and was prescribed antibiotics for it at [facility].     Past Medical History  Diagnosis Date  . Hypertension   . Seizures   . Panic attack   . Chronic back pain   History reviewed. No pertinent past surgical history. History reviewed. No pertinent family history. History   Social History  . Marital Status: Legally Separated    Spouse Name: N/A    Number of Children: N/A  . Years of Education: N/A   Occupational History  . Not on file.   Social History Main Topics  . Smoking status: Current Every Day Smoker -- 1.00 packs/day for 14 years    Types: Cigarettes  . Smokeless tobacco: Not on file  . Alcohol Use: No  . Drug Use: Not on file  . Sexual Activity: Not on file   Other Topics Concern  . Not on file   Social History Narrative  . No narrative on file   No Known Allergies Current Outpatient Prescriptions on File Prior to Visit    Medication Sig Dispense Refill  . acetaminophen (TYLENOL) 500 MG tablet Take 500 mg by mouth every 6 (six) hours as needed for pain.      Marland Kitchen ALPRAZolam (XANAX) 1 MG tablet Take 1 mg by mouth at bedtime as needed for sleep.      . Ipratropium-Albuterol (COMBIVENT) 20-100 MCG/ACT AERS respimat Inhale 1 puff into the lungs every 6 (six) hours as needed for wheezing or shortness of breath.  1 Inhaler  1  . naproxen sodium (ANAPROX) 220 MG tablet Take 440 mg by mouth as needed (fever).      . predniSONE (DELTASONE) 20 MG tablet Take 2 tablets (40 mg total) by mouth daily.  10 tablet  0  . traZODone (DESYREL) 100 MG tablet Take 200 mg by mouth at bedtime.      . chlorpheniramine-HYDROcodone (TUSSIONEX PENNKINETIC ER) 10-8 MG/5ML LQCR Take 5 mLs by mouth every 12 (twelve) hours as needed.  120 mL  0  . levofloxacin (LEVAQUIN) 750 MG tablet Take 1 tablet (750 mg total) by mouth daily. X 7 days  7 tablet  0   No current facility-administered medications on file prior to visit.      Review of Systems  Musculoskeletal: Positive for  back pain and neck pain.  Neurological: Negative for syncope.       Objective:   Physical Exam  Musculoskeletal: He exhibits tenderness.       Right knee: Normal.       Left knee: Normal.  Tenderness mid T spine. No lower back tenderness.    Neurological:  Reflex Scores:      Brachioradialis reflexes are 2+ on the right side and 2+ on the left side.      Patellar reflexes are 2+ on the right side and 2+ on the left side. Bicep reflexes are trace.          Assessment & Plan:   Orders Placed This Encounter  Procedures  . DG Chest 2 View    Standing Status: Future     Number of Occurrences:      Standing Expiration Date: 12/07/2014    Order Specific Question:  Reason for Exam (SYMPTOM  OR DIAGNOSIS REQUIRED)    Answer:  Neck pain, back pain    Order Specific Question:  Preferred imaging location?    Answer:  External  . DG Cervical Spine Complete     Standing Status: Future     Number of Occurrences:      Standing Expiration Date: 12/07/2014    Order Specific Question:  Reason for Exam (SYMPTOM  OR DIAGNOSIS REQUIRED)    Answer:  Neck pain    Order Specific Question:  Preferred imaging location?    Answer:  External  . DG Thoracic Spine 2 View    Standing Status: Future     Number of Occurrences:      Standing Expiration Date: 12/07/2014    Order Specific Question:  Reason for Exam (SYMPTOM  OR DIAGNOSIS REQUIRED)    Answer:  back pain    Order Specific Question:  Preferred imaging location?    Answer:  External   Problem List Items Addressed This Visit   None    Visit Diagnoses   Neck pain    -  Primary    Relevant Orders       DG Chest 2 View       DG Cervical Spine Complete       DG Thoracic Spine 2 View    Back pain        Relevant Orders       DG Chest 2 View       DG Cervical Spine Complete       DG Thoracic Spine 2 View    Fall, initial encounter        Relevant Orders       DG Chest 2 View       DG Cervical Spine Complete       DG Thoracic Spine 2 View      [facility] reading (PRIMARY) by  Dr.Kimberely Mccannon is C5-C6 degenerative disc disease but no acute injury to the C-spine. T-spine films show questionable irregularity at T7 on the AP view. Please comment. Chest x-ray appears normal.      I personally performed the services described in this documentation, which was scribed in my presence. The recorded information has been reviewed and is accurate.  Spoke with the radiologist. He does not see any acute injury to the C-spine, lungs, or T-spine. He does have a mild scoliosis of the thoracic spine but did not see signs of a compression fracture. We'll treat with pain medications for the present time recheck on Sunday.

## 2013-10-10 ENCOUNTER — Ambulatory Visit (INDEPENDENT_AMBULATORY_CARE_PROVIDER_SITE_OTHER): Payer: BC Managed Care – PPO | Admitting: Emergency Medicine

## 2013-10-10 VITALS — BP 118/82 | HR 108 | Temp 98.9°F | Resp 16

## 2013-10-10 DIAGNOSIS — M542 Cervicalgia: Secondary | ICD-10-CM

## 2013-10-10 DIAGNOSIS — M549 Dorsalgia, unspecified: Secondary | ICD-10-CM

## 2013-10-10 MED ORDER — OXYCODONE-ACETAMINOPHEN 10-325 MG PO TABS: 1.0000 | ORAL_TABLET | Freq: Four times a day (QID) | ORAL | Status: DC | PRN

## 2013-10-10 MED ORDER — METAXALONE 800 MG PO TABS: ORAL_TABLET | ORAL | Status: DC

## 2013-10-10 NOTE — Progress Notes (Signed)
Subjective:  This chart was scribed for Collene Gobble, MD by Arlan Organ, ED Scribe. This patient was seen in room Room 10 and the patient's care was started 1:18 PM.    Patient ID: Alexis West, male    DOB: 1976-02-05, 37 y.o.   MRN: [id number]  HPI HPI Comments: Alexis West is a 37 y.o. Male with a hx of chronic back pain who presents to [facility] seeking a follow up today from his visit on 10/9 for his back and neck pain. Pt states his pain has worsened, and he has not been able to sleep due to the discomfort. He states the prescribed medications from his last visit has not given him any relief. Pt denies any hx of surgeries.  Pt was seen at [facility] but is requesting to not go back to that facility. Pt states they did not do anything to help him. He says they wanted to hold him out of work for 12 more weeks, and pt requested for them to sign him out.  Pt states he is currently back to work, and has been back for 4-5 weeks.  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 Pt states he has been with this company for about 10 months.  Review of Systems  Musculoskeletal: Positive for back pain (lower back pain).  Neurological: Negative for numbness.    Past Medical History  Diagnosis Date  . Hypertension   . Seizures   . Panic attack   . Chronic back pain     History   Social History  . Marital Status: Legally Separated    Spouse Name: N/A    Number of Children: N/A  . Years of Education: N/A   Occupational History  . Not on file.   Social History Main Topics  . Smoking status: Current Every Day Smoker -- 1.00 packs/day for 14 years    Types: Cigarettes  . Smokeless tobacco: Not on file  . Alcohol Use: No  . Drug Use: Not on file  . Sexual Activity: Not on file   Other Topics Concern  . Not on file   Social History Narrative  . No narrative on file    History reviewed. No pertinent past surgical history.    Results for orders placed in visit on 09/27/13  POCT CBC      Result Value Range   WBC 8.7  4.6 - 10.2 K/uL   Lymph, poc 3.0  0.6 - 3.4   POC LYMPH PERCENT 34.4  10 - 50 %L   MID (cbc) 1.2 (*) 0 - 0.9   POC MID % 14.2 (*) 0 - 12 %M   POC Granulocyte 4.5  2 - 6.9   Granulocyte percent 51.4  37 - 80 %G   RBC 5.05  4.69 - 6.13 M/uL   Hemoglobin 14.8  14.1 - 18.1 g/dL   HCT, POC 40.9  81.1 - 53.7 %   MCV 92.9  80 - 97 fL   MCH, POC 29.3  27 - 31.2 pg   MCHC 31.6 (*) 31.8 - 35.4 g/dL   RDW, POC 91.4     Platelet Count, POC 148  142 - 424 K/uL   MPV 10.1  0 - 99.8 fL       Objective:   Physical Exam  Constitutional: He is oriented to person, place, and time. He appears well-developed and well-nourished. No distress.  HENT:  Head:  Normocephalic.  Eyes: Conjunctivae are normal. Pupils are equal, round, and reactive to light. No scleral icterus.  Neck: Normal range of motion. Neck supple. No thyromegaly present.  Cardiovascular: Normal rate and regular rhythm.  Exam reveals no gallop and no friction rub.   No murmur heard. Pulmonary/Chest: Effort normal and breath sounds normal. No respiratory distress. He has no wheezes. He has no rales.  Abdominal: Soft. Bowel sounds are normal. He exhibits no distension. There is no tenderness. There is no rebound.  Musculoskeletal: He exhibits tenderness.  Tenderness to palpation over posterior cervical area Markedly decreased ROM in both directions and flexion and extension No focal upper extremity weakness Positive for straight leg raise at 75 degrees Unable to turn head past 15 degrees to the right  Neurological: He is alert and oriented to person, place, and time.  Skin: Skin is warm and dry. No rash noted.  Psychiatric: He has a normal mood and affect. His behavior is normal.   Assessment & Plan:  Meds refill referral made to Dr. Yevette Edwards for evaluation

## 2013-10-15 ENCOUNTER — Ambulatory Visit (INDEPENDENT_AMBULATORY_CARE_PROVIDER_SITE_OTHER): Payer: BC Managed Care – PPO | Admitting: Emergency Medicine

## 2013-10-15 VITALS — BP 122/74 | HR 88 | Temp 97.4°F | Resp 17 | Ht 70.5 in | Wt 158.0 lb

## 2013-10-15 DIAGNOSIS — M542 Cervicalgia: Secondary | ICD-10-CM

## 2013-10-15 MED ORDER — OXYCODONE-ACETAMINOPHEN 10-325 MG PO TABS: 1.0000 | ORAL_TABLET | Freq: Four times a day (QID) | ORAL | Status: DC | PRN

## 2013-10-15 NOTE — Progress Notes (Signed)
This chart was scribed for Lesle Chris, MD by Joaquin Music, ED Scribe. This patient was seen in room Room 1 and the patient's care was started at 3:14 PM  Subjective:    Patient ID: Alexis West, male    DOB: 02-08-1976, 37 y.o.   MRN: [id number]  HPI Alexis West is a 37 y.o. male who presents to the [facility] complaining of F/U appointment. Pt states he has been taking his muscle relaxer as prescribed. Pt states he worked Monday and has not had to work for the 4 days that have followed. Pt states he is still having complications of with his neck and lower back with associated pain. He rates his current pain as an 8/10 and states once he takes his medication his pain is a 3/10. Pt has limited ROM. He states the bruising from his digits has faded.   Pt has an appointment with Dr. Merian Capron Tuesday Oct 19, 2013. Pt has been taking his pain medications as prescribed.    Review of Systems  Musculoskeletal: Positive for back pain and neck pain.       Objective:   Physical Exam patient has exquisite tenderness on the right side of the neck. He is limited range of motion to the right and limited rotation of approximately 30. He has much better rotation to the left. He has limited flexion and extension of the neck. His deep tendon reflexes of the upper extremities are 2+ and symmetrical motor strength 5 out of 5. The left knee with reflexes 1+ right knee 2+.        Assessment & Plan:  Patient here with persistent severe neck pain following a fall. He has an appointment to see Dr. Delrae Alfred on Monday. We'll go ahead and schedule an MRI of the C-spine. I refilled his pain medications and Dr.Dumonski can take over his treatment after Monday

## 2013-10-22 ENCOUNTER — Ambulatory Visit (INDEPENDENT_AMBULATORY_CARE_PROVIDER_SITE_OTHER): Payer: BC Managed Care – PPO | Admitting: Internal Medicine

## 2013-10-22 ENCOUNTER — Ambulatory Visit: Payer: BC Managed Care – PPO

## 2013-10-22 VITALS — BP 130/72 | HR 81 | Temp 98.2°F | Resp 18 | Ht 70.5 in | Wt 160.6 lb

## 2013-10-22 DIAGNOSIS — F17200 Nicotine dependence, unspecified, uncomplicated: Secondary | ICD-10-CM | POA: Insufficient documentation

## 2013-10-22 DIAGNOSIS — R509 Fever, unspecified: Secondary | ICD-10-CM

## 2013-10-22 DIAGNOSIS — J329 Chronic sinusitis, unspecified: Secondary | ICD-10-CM

## 2013-10-22 DIAGNOSIS — F411 Generalized anxiety disorder: Secondary | ICD-10-CM | POA: Insufficient documentation

## 2013-10-22 DIAGNOSIS — R05 Cough: Secondary | ICD-10-CM

## 2013-10-22 LAB — POCT CBC
Hemoglobin: 15.4 g/dL (ref 14.1–18.1)
Lymph, poc: 2.7 (ref 0.6–3.4)
MCH, POC: 30.6 pg (ref 27–31.2)
MCHC: 32.1 g/dL (ref 31.8–35.4)
MCV: 95.2 fL (ref 80–97)
MID (cbc): 0.8 (ref 0–0.9)
RBC: 5.04 M/uL (ref 4.69–6.13)
WBC: 7.4 10*3/uL (ref 4.6–10.2)

## 2013-10-22 MED ORDER — AMOXICILLIN 500 MG PO CAPS: 1000.0000 mg | ORAL_CAPSULE | Freq: Two times a day (BID) | ORAL | Status: AC

## 2013-10-22 MED ORDER — HYDROCODONE-HOMATROPINE 5-1.5 MG/5ML PO SYRP: 5.0000 mL | ORAL_SOLUTION | Freq: Four times a day (QID) | ORAL | Status: DC | PRN

## 2013-10-22 NOTE — Progress Notes (Deleted)
  Subjective:    Patient ID: Alexis West, male    DOB: [birth year], 37 y.o.   MRN: [id number]  HPI    Review of Systems     Objective:   Physical Exam        Assessment & Plan:

## 2013-10-22 NOTE — Progress Notes (Signed)
Subjective:    Patient ID: Alexis West, male    DOB: December 03, 1976, 37 y.o.   MRN: [id number]  HPI Patient presents today with worsening productive cough. Fever to 102 in the evenings. Taking ibuprofen for fever. Patient was seen about 9/24 in the ER and was prescribed Levaquin. He presented to [facility] 5 days into Levaquin course with worsening fever and cough. CXR was improved and he was given a 5 day course of prednisone. He is currently producing brown-green sputum. Cough is keeping him up at night. Intermittent nose bleeds. Some green nasal drainage. SOB with activity. No wheezing. No headaches. No nausea or vomiting. Appetite decreased. Weight loss 3 pounds in last month. Taking Mucinex without improvement of cough. Using combivent.   Review of Systems  Constitutional: Positive for fever, appetite change and unexpected weight change.  HENT: Positive for congestion, nosebleeds, postnasal drip and rhinorrhea. Negative for sinus pressure and sore throat.   Respiratory: Positive for cough and shortness of breath. Negative for wheezing.   Cardiovascular: Negative for chest pain.  Neurological: Negative for headaches.       Objective:   Physical Exam  Nursing note and vitals reviewed. Constitutional: He is oriented to person, place, and time. He appears well-developed and well-nourished. No distress.  HENT:  Right Ear: Tympanic membrane, external ear and ear canal normal.  Left Ear: External ear and ear canal normal. Tympanic membrane is bulging.  Eyes: Pupils are equal, round, and reactive to light. Right eye exhibits no discharge. Left eye exhibits no discharge.  Neck: Normal range of motion. Neck supple.  Right anterior cervical node, tender, mobile.  Cardiovascular: Normal rate and normal heart sounds.   Pulmonary/Chest: Effort normal and breath sounds normal. No respiratory distress. He has no wheezes. He has no rales.  Nonproductive cough observed.  Lymphadenopathy:    He has  cervical adenopathy.  Neurological: He is alert and oriented to person, place, and time.  Skin: Skin is warm and dry. He is not diaphoretic.  Psychiatric: He has a normal mood and affect. His behavior is normal. Judgment and thought content normal.   Results for orders placed in visit on 10/22/13  POCT CBC      Result Value Range   WBC 7.4  4.6 - 10.2 K/uL   Lymph, poc 2.7  0.6 - 3.4   POC LYMPH PERCENT 36.0  10 - 50 %L   MID (cbc) 0.8  0 - 0.9   POC MID % 10.2  0 - 12 %M   POC Granulocyte 4.0  2 - 6.9   Granulocyte percent 53.8  37 - 80 %G   RBC 5.04  4.69 - 6.13 M/uL   Hemoglobin 15.4  14.1 - 18.1 g/dL   HCT, POC 40.9  81.1 - 53.7 %   MCV 95.2  80 - 97 fL   MCH, POC 30.6  27 - 31.2 pg   MCHC 32.1  31.8 - 35.4 g/dL   RDW, POC 91.4     Platelet Count, POC 152  142 - 424 K/uL   MPV 12.5  0 - 99.8 fL     [facility] reading (PRIMARY) by  Dr. Bryer Gottsch=NAD chest      Assessment & Plan:  Cough - Plan: POCT CBC, DG Chest 2 View  Fever - Plan: POCT CBC, DG Chest 2 View  Sinusitis  Meds ordered this encounter  Medications  . amoxicillin (AMOXIL) 500 MG capsule    Sig: Take 2 capsules (1,000 mg total)  by mouth 2 (two) times daily.    Dispense:  40 capsule    Refill:  0  . HYDROcodone-homatropine (HYCODAN) 5-1.5 MG/5ML syrup    Sig: Take 5 mLs by mouth every 6 (six) hours as needed for cough.    Dispense:  120 mL    Refill:  0   1- Meds as above. Increase fluids. Return if no improvement with treatment.  I have reviewed and agree with documentation By FNP Leone Payor of our encounter  Harrel Lemon. Merla Riches, M.D.

## 2013-10-26 ENCOUNTER — Encounter (HOSPITAL_COMMUNITY): Payer: Self-pay | Admitting: Emergency Medicine

## 2013-10-26 ENCOUNTER — Emergency Department (HOSPITAL_COMMUNITY)
Admission: EM | Admit: 2013-10-26 | Discharge: 2013-10-26 | Disposition: A | Discharge status: Home / Self Care | Payer: BC Managed Care – PPO | Attending: Emergency Medicine | Admitting: Emergency Medicine

## 2013-10-26 ENCOUNTER — Emergency Department (HOSPITAL_COMMUNITY): Payer: BC Managed Care – PPO

## 2013-10-26 DIAGNOSIS — Z792 Long term (current) use of antibiotics: Secondary | ICD-10-CM | POA: Insufficient documentation

## 2013-10-26 DIAGNOSIS — F17200 Nicotine dependence, unspecified, uncomplicated: Secondary | ICD-10-CM | POA: Insufficient documentation

## 2013-10-26 DIAGNOSIS — Z79899 Other long term (current) drug therapy: Secondary | ICD-10-CM | POA: Insufficient documentation

## 2013-10-26 DIAGNOSIS — Z8669 Personal history of other diseases of the nervous system and sense organs: Secondary | ICD-10-CM | POA: Insufficient documentation

## 2013-10-26 DIAGNOSIS — F410 Panic disorder [episodic paroxysmal anxiety] without agoraphobia: Secondary | ICD-10-CM | POA: Insufficient documentation

## 2013-10-26 DIAGNOSIS — M549 Dorsalgia, unspecified: Secondary | ICD-10-CM | POA: Insufficient documentation

## 2013-10-26 DIAGNOSIS — G8929 Other chronic pain: Secondary | ICD-10-CM | POA: Insufficient documentation

## 2013-10-26 DIAGNOSIS — R040 Epistaxis: Secondary | ICD-10-CM

## 2013-10-26 DIAGNOSIS — I10 Essential (primary) hypertension: Secondary | ICD-10-CM | POA: Insufficient documentation

## 2013-10-26 LAB — COMPREHENSIVE METABOLIC PANEL
ALT: 10 U/L (ref 0–53)
AST: 19 U/L (ref 0–37)
BUN: 9 mg/dL (ref 6–23)
CO2: 28 mEq/L (ref 19–32)
Calcium: 9.9 mg/dL (ref 8.4–10.5)
Creatinine, Ser: 0.81 mg/dL (ref 0.50–1.35)
GFR calc Af Amer: >90 mL/min (ref >90)
GFR calc non Af Amer: >90 mL/min (ref >90)
Glucose, Bld: 84 mg/dL (ref 70–99)
Potassium: 4 mEq/L (ref 3.5–5.1)
Total Bilirubin: 0.7 mg/dL (ref 0.3–1.2)

## 2013-10-26 LAB — RAPID URINE DRUG SCREEN, HOSP PERFORMED
Barbiturates: NOT DETECTED
Benzodiazepines: NOT DETECTED
Cocaine: NOT DETECTED
Opiates: NOT DETECTED
Tetrahydrocannabinol: NOT DETECTED

## 2013-10-26 LAB — URINALYSIS, ROUTINE W REFLEX MICROSCOPIC
Bilirubin Urine: NEGATIVE
Hgb urine dipstick: NEGATIVE
Nitrite: NEGATIVE
Specific Gravity, Urine: 1.004 — ABNORMAL LOW (ref 1.005–1.030)
pH: 8 (ref 5.0–8.0)

## 2013-10-26 LAB — CBC WITH DIFFERENTIAL/PLATELET
Basophils Relative: 0 % (ref 0–1)
Eosinophils Absolute: 0.1 10*3/uL (ref 0.0–0.7)
Eosinophils Relative: 1 % (ref 0–5)
Lymphs Abs: 1.7 10*3/uL (ref 0.7–4.0)
MCH: 30.5 pg (ref 26.0–34.0)
MCHC: 35.5 g/dL (ref 30.0–36.0)
MCV: 86 fL (ref 78.0–100.0)
Monocytes Absolute: 0.5 10*3/uL (ref 0.1–1.0)
Neutrophils Relative %: 72 % (ref 43–77)
Platelets: 196 10*3/uL (ref 150–400)
RBC: 5.64 MIL/uL (ref 4.22–5.81)

## 2013-10-26 MED ORDER — SODIUM CHLORIDE 0.9 % IV SOLN
Freq: Once | INTRAVENOUS | Status: AC
  Administered 2013-10-26: 12:00:00 via INTRAVENOUS

## 2013-10-26 MED ORDER — OXYMETAZOLINE HCL 0.05 % NA SOLN: NASAL | Status: DC

## 2013-10-26 MED ORDER — ONDANSETRON HCL 4 MG PO TABS: ORAL_TABLET | ORAL | Status: DC

## 2013-10-26 NOTE — ED Notes (Signed)
Pt brought to ED by EMS with nose bleed.Pt says he was exposed to ammonia last week.

## 2013-10-26 NOTE — ED Provider Notes (Signed)
CSN: [id number]     Arrival date & time 10/26/13  1114 History   First MD Initiated Contact with Patient 10/26/13 1114     Chief Complaint  Patient presents with  . Epistaxis   (Consider location/radiation/quality/duration/timing/severity/associated sxs/prior Treatment) HPI  Alexis West Is a 37 year old male with a past medical history of community-acquired pneumonia diagnosed one month ago.  Patient states that since that time he has been treated with antibiotics.  He has had daily nosebleeds up to 7 times a day.  He states that they're usually resolved with pressure.  Patient also complains of headaches, intermittent syncope, fatigue and weakness.  The patient has also had intermittent headache.  He has a history of neck pain denies stiff neck, rashes.  Patient's wife states that last night he also was febrile.  Today the patient was at work and he developed a nosebleed, weakness, nausea and vomiting.  The patient was brought into the emergency department for evaluation patient has had close outpatient followup with recent repeat chest x-ray 4 days ago thaT is stable pleural thickening.  Patient is currently taking Amoxil.. view of the patient's labs shows no down pending thrombocytes.  And a stable hemoglobin.  The patient denies any current fever, change since stools, chest pain, shortness of breath, rash. She denies any visual changes, unilateral weakness, difficulty with speech or swallowing.  Past Medical History  Diagnosis Date  . Hypertension   . Seizures   . Panic attack   . Chronic back pain    History reviewed. No pertinent past surgical history. No family history on file. History  Substance Use Topics  . Smoking status: Current Every Day Smoker -- 1.00 packs/day for 14 years    Types: Cigarettes  . Smokeless tobacco: Not on file  . Alcohol Use: No    Review of Systems Ten systems reviewed and are negative for acute change, except as noted in the HPI.   Allergies   Morphine and related  Home Medications   Current Outpatient Rx  Name  Route  Sig  Dispense  Refill  . acetaminophen (TYLENOL) 500 MG tablet   Oral   Take 1,000 mg by mouth every 6 (six) hours as needed for pain.          Marland Kitchen albuterol (PROVENTIL HFA;VENTOLIN HFA) 108 (90 BASE) MCG/ACT inhaler   Inhalation   Inhale 2 puffs into the lungs every 6 (six) hours as needed for wheezing.         Marland Kitchen ALPRAZolam (XANAX) 1 MG tablet   Oral   Take 1 mg by mouth at bedtime as needed for sleep.         . naproxen sodium (ANAPROX) 220 MG tablet   Oral   Take 440 mg by mouth as needed (fever).         . traZODone (DESYREL) 100 MG tablet   Oral   Take 200 mg by mouth at bedtime.         Marland Kitchen amoxicillin (AMOXIL) 500 MG capsule   Oral   Take 2 capsules (1,000 mg total) by mouth 2 (two) times daily.   40 capsule   0   . HYDROcodone-homatropine (HYCODAN) 5-1.5 MG/5ML syrup   Oral   Take 5 mLs by mouth every 6 (six) hours as needed for cough.   120 mL   0    BP 112/66  Pulse 91  Temp(Src) 98.8 F (37.1 C) (Oral)  Resp 18  SpO2 100% Physical Exam Physical  Exam  Nursing note and vitals reviewed. Constitutional: Patient in no acute distress HENT:  Head: Normocephalic and atraumatic.  Eyes: Conjunctivae normal are normal. No scleral icterus. MYDRIATIC PUPILS. Funduscopic exam reveals no papilledema, no AV nicking or hemorrhage. Neck: Normal range of motion. Neck supple.  Cardiovascular: Normal rate, regular rhythm and normal heart sounds.   Pulmonary/Chest: Effort normal and breath sounds normal. No respiratory distress.  Abdominal: Soft. There is no tenderness.  Musculoskeletal: He exhibits no edema.  Neurological: He is alert.   Skin: Skin is warm and dry. He is not diaphoretic.  Psychiatric: His behavior is normal.    ED Course  Procedures (including critical care time) Labs Review Labs Reviewed  URINALYSIS, ROUTINE W REFLEX MICROSCOPIC - Abnormal; Notable for the  following:    Specific Gravity, Urine 1.004 (*)    All other components within normal limits  CBC WITH DIFFERENTIAL - Abnormal; Notable for the following:    Hemoglobin 17.2 (*)    All other components within normal limits  URINE RAPID DRUG SCREEN (HOSP PERFORMED)  COMPREHENSIVE METABOLIC PANEL  LIPASE, BLOOD   Imaging Review Ct Head Wo Contrast  10/26/2013   CLINICAL DATA:  Severe headache with hypertension and nosebleed of uncertain etiology  EXAM: CT HEAD WITHOUT CONTRAST  TECHNIQUE: Contiguous axial images were obtained from the base of the skull through the vertex without intravenous contrast. Study was obtained within 24 hr of patient's arrival at the emergency department.  COMPARISON:  February 09, 2007  FINDINGS: The ventricles are normal in size and configuration. Prominence of the cisterna magna is a stable anatomic variant. There is no mass, hemorrhage, extra-axial fluid collection, or midline shift. The gray-white compartments are normal. There is no demonstrable acute infarct.  Bony calvarium appears intact. The mastoid air cells are clear. There is mild rightward deviation of the nasal septum. There is no lesions seen in the nasal region. The nasal terminates do not appear enlarged.  IMPRESSION: Mild leftward deviation of nasal septum. No mass or terminate edema appreciated in the nasal regions. Study otherwise unremarkable. No intracranial mass, hemorrhage, or acute infarct.   Electronically Signed   By: Bretta Bang M.D.   On: 10/26/2013 15:07    EKG Interpretation   None       MDM   1. Epistaxis, recurrent    Patient with multiple vague sxs of fatigue, HA, syncope, epistaxis. Patient's wife states that he has had change in his personality and mentation and she is concerned. Patient's physical exam is benign and no focal neuro deficits. I have discussed the case with Dr. Rubin Payor and will obtain Head CT for further eval.       4:13 PM BP 112/66  Pulse 91   Temp(Src) 98.8 F (37.1 C) (Oral)  Resp 18  SpO2 100% Patient ct negative. A febrile and stable vitals. Patient has had no vomiting or recurrence of epistaxis while here in the ED. The patient appears reasonably screened and/or stabilized for discharge and I doubt any other medical condition or other [facility] requiring further screening, evaluation, or treatment in the ED at this time prior to discharge. Patient is to follow up with his pcp/ENT regarding continued sxs. I will d/c with afrin nasal spray to utilize for the next 24 hours .    Arthor Captain, PA-C 10/27/13 2105

## 2013-10-28 NOTE — ED Provider Notes (Signed)
Medical screening examination/treatment/procedure(s) were performed by non-physician practitioner and as supervising physician I was immediately available for consultation/collaboration.  EKG Interpretation   None        Mackinzie Vuncannon R. Rikita Grabert, MD 10/28/13 0019 

## 2013-11-08 ENCOUNTER — Ambulatory Visit: Payer: BC Managed Care – PPO

## 2013-11-08 ENCOUNTER — Ambulatory Visit (INDEPENDENT_AMBULATORY_CARE_PROVIDER_SITE_OTHER): Payer: BC Managed Care – PPO | Admitting: Family Medicine

## 2013-11-08 VITALS — BP 110/66 | HR 110 | Temp 98.6°F | Resp 18 | Ht 70.5 in | Wt 157.8 lb

## 2013-11-08 DIAGNOSIS — Z720 Tobacco use: Secondary | ICD-10-CM

## 2013-11-08 DIAGNOSIS — R05 Cough: Secondary | ICD-10-CM

## 2013-11-08 DIAGNOSIS — F17200 Nicotine dependence, unspecified, uncomplicated: Secondary | ICD-10-CM

## 2013-11-08 DIAGNOSIS — R634 Abnormal weight loss: Secondary | ICD-10-CM

## 2013-11-08 MED ORDER — HYDROCOD POLST-CHLORPHEN POLST 10-8 MG/5ML PO LQCR: 5.0000 mL | Freq: Two times a day (BID) | ORAL | Status: DC | PRN

## 2013-11-08 NOTE — Patient Instructions (Signed)
Continue taking the doxycyline.  Drink plenty of fluids (water is best!)  Use the Tussionex for cough.  Please come back in 48-72 hours to have your TB skin test read and your blood drawn.  Come back sooner if any symptoms are worsening

## 2013-11-08 NOTE — Progress Notes (Signed)
Subjective:    Patient ID: Alexis West, male    DOB: 01-Jan-1976, 37 y.o.   MRN: [id number]  HPI   [name] is a 37 yr old male here because "I can't get over this pneumonia."  Reports that he continues to cough "a lot of nasty stuff up," but states this is not as bad as it was.  He occ feels SOB.  Denies wheezing.  Chest and abd muscles are sore from coughing.  Cough is worse at night.  Not using anything currently for cough, previously used Tussionex.  Reports he is still having fevers but they are fewer and further between.  Reports Tmax 100-101F.  Occ night sweats.  Admits to a 35lb weight loss since first dx'd with pneumonia.  (On chart review, pt has lost about 25lbs since Aug 2014.)  He is using albuterol usually twice per day.  He is somewhat of a poor historian, so I am unclear of all of the details of his course.    He completed a course of levaquin initially after dx of CAP at ED on 09/22/13.  Was started on amoxicillin 10/22/13 but reports an allergic reaction to this - vomiting, nose bleeds, passing out.  He reports he was seen at a [facility] about 5-6 days ago, prescribed doxycycline.  Did not start this until yesterday.  Reports that this was prescribed for the pneumonia.    He is a current smoker, but reports he has almost quit.  Only smoking a few cigarettes per day.    No hx of TB or +PPD.  No hx asthma, lung disease.  No known HIV or exposures.  Review of Systems  Constitutional: Positive for fever (intermittent), appetite change (decreased) and unexpected weight change (25lb wt loss).  HENT: Positive for sore throat.   Respiratory: Positive for cough and shortness of breath. Negative for wheezing.   Cardiovascular: Positive for chest pain (muscular, with cough).  Gastrointestinal: Negative.   Musculoskeletal: Negative.   Skin: Negative.   Neurological: Negative.        Objective:   Physical Exam  Vitals reviewed. Constitutional: He is oriented to person, place, and  time. He appears well-developed and well-nourished. No distress.  HENT:  Head: Normocephalic and atraumatic.  Eyes: Conjunctivae are normal. No scleral icterus.  Neck: Neck supple.  Cardiovascular: Normal rate, regular rhythm and normal heart sounds.   Pulmonary/Chest: He has no wheezes. He has no rales.  Poor effort on lung exam; cough with deep insp, otherwise CTA  Abdominal: Soft. There is tenderness (diffusely, due to cough).  Neurological: He is alert and oriented to person, place, and time.  Skin: Skin is warm and dry.  Psychiatric: He has a normal mood and affect. His behavior is normal.      [facility] reading (PRIMARY) by  Dr. Patsy Lager - negative      Assessment & Plan:  Cough - Plan: DG Chest 2 View, TB Skin Test, chlorpheniramine-HYDROcodone (TUSSIONEX PENNKINETIC ER) 10-8 MG/5ML LQCR, HIV antibody, Bordetella Pertussis PCR  Loss of weight - Plan: DG Chest 2 View, TB Skin Test, HIV antibody, Bordetella Pertussis PCR  Tobacco abuse - Plan: DG Chest 2 View   Alexis West is a 37 yr old male here for persistent cough and low grade fevers after diagnosis of CAP 09/22/13.  Pt has been treated with levaquin, amoxicillin (briefly), and now doxycyline.  Cough is somewhat improved but still present.  Additionally he has lost about 25 lbs in the last 3  months.  CXR is negative today.  PPD placed.  Pt refused bloodwork today because "they'll have to get it out of my finger, I'm not hydrated," and he did not want to wait for the blood draw.  He is agreeable to having labs drawn in 48 hrs when he returns for PPD read.  Future orders placed.  He will hydrate in preparation for this.  Discussed my concern for symptoms - will draw HIV, pertussis in addition to PPD placement.  For now, continue doxy.  I have given him Tussionex for cough.  Continue albuterol q4-6h.  Push fluids, rest.  Will follow up on labs.  Encouraged complete smoking cessation.  May need to consider referral to pulm if not  improving.     Loleta Dicker MHS, PA-C [facility] 11/10/20148:01 PM

## 2013-11-08 NOTE — Progress Notes (Signed)
  Tuberculosis Risk Questionnaire  1. No Were you born outside the [place] in one of the following parts of the world: [place], [place], [place], [place] or [place]?    2. No Have you traveled outside the [place] and lived for more than one month in one of the following parts of the world: [place], [place], [place], [place] or [place]?    3. No Do you have a compromised immune system such as from any of the following conditions:HIV/AIDS, organ or bone marrow transplantation, diabetes, immunosuppressive medicines (e.g. Prednisone, Remicaide), leukemia, lymphoma, cancer of the head or neck, gastrectomy or jejunal bypass, end-stage renal disease (on dialysis), or silicosis?     4. No Have you ever or do you plan on working in: a residential care center, a health care facility, a jail or prison or homeless shelter?    5. No Have you ever: injected illegal drugs, used crack cocaine, lived in a homeless shelter  or been in jail or prison?     6. No Have you ever been exposed to anyone with infectious tuberculosis?    Tuberculosis Symptom Questionnaire  Do you currently have any of the following symptoms?  1. Yes  Unexplained cough lasting more than 3 weeks?   2. Yes  Unexplained fever lasting more than 3 weeks.   3. No Night Sweats (sweating that leaves the bedclothes and sheets wet)     4. Yes  Shortness of Breath   5. Yes  Chest Pain   6. Yes  Unintentional weight loss    7. No Unexplained fatigue (very tired for no reason)

## 2013-11-09 ENCOUNTER — Other Ambulatory Visit: Payer: Self-pay | Admitting: Physician Assistant

## 2013-11-09 ENCOUNTER — Telehealth: Payer: Self-pay

## 2013-11-09 DIAGNOSIS — Z720 Tobacco use: Secondary | ICD-10-CM

## 2013-11-09 DIAGNOSIS — R05 Cough: Secondary | ICD-10-CM

## 2013-11-09 DIAGNOSIS — R634 Abnormal weight loss: Secondary | ICD-10-CM

## 2013-11-09 NOTE — Telephone Encounter (Signed)
 Imaging called with results for the CXR.  Please review report

## 2013-11-09 NOTE — Progress Notes (Signed)
Per radiology recommendation, will order non-con chest CT to further evaluate pt's symptoms

## 2013-11-10 ENCOUNTER — Encounter: Payer: Self-pay | Admitting: Radiology

## 2013-11-10 NOTE — Telephone Encounter (Signed)
Called pt to discuss xray results.  No VM set up, unable to leave message.  Message has been sent to clinical and rad pools to continue attempting to contact pt

## 2013-11-15 ENCOUNTER — Encounter: Payer: Self-pay | Admitting: Radiology

## 2013-11-17 ENCOUNTER — Encounter: Payer: Self-pay | Admitting: Radiology

## 2013-11-17 ENCOUNTER — Other Ambulatory Visit: Payer: BC Managed Care – PPO

## 2013-11-17 ENCOUNTER — Ambulatory Visit (HOSPITAL_COMMUNITY)
Admission: RE | Admit: 2013-11-17 | Discharge: 2013-11-17 | Disposition: A | Discharge status: Home / Self Care | Payer: BC Managed Care – PPO | Source: Ambulatory Visit | Attending: Physician Assistant | Admitting: Physician Assistant

## 2013-11-17 ENCOUNTER — Ambulatory Visit (INDEPENDENT_AMBULATORY_CARE_PROVIDER_SITE_OTHER): Payer: BC Managed Care – PPO | Admitting: Family Medicine

## 2013-11-17 VITALS — BP 132/78 | HR 101 | Temp 98.9°F | Resp 17 | Ht 70.5 in | Wt 156.0 lb

## 2013-11-17 DIAGNOSIS — R634 Abnormal weight loss: Secondary | ICD-10-CM

## 2013-11-17 DIAGNOSIS — R05 Cough: Secondary | ICD-10-CM

## 2013-11-17 DIAGNOSIS — R059 Cough, unspecified: Secondary | ICD-10-CM | POA: Insufficient documentation

## 2013-11-17 DIAGNOSIS — R079 Chest pain, unspecified: Secondary | ICD-10-CM

## 2013-11-17 DIAGNOSIS — J984 Other disorders of lung: Secondary | ICD-10-CM | POA: Insufficient documentation

## 2013-11-17 DIAGNOSIS — R918 Other nonspecific abnormal finding of lung field: Secondary | ICD-10-CM | POA: Insufficient documentation

## 2013-11-17 DIAGNOSIS — Z720 Tobacco use: Secondary | ICD-10-CM

## 2013-11-17 DIAGNOSIS — E041 Nontoxic single thyroid nodule: Secondary | ICD-10-CM

## 2013-11-17 DIAGNOSIS — R911 Solitary pulmonary nodule: Secondary | ICD-10-CM

## 2013-11-17 LAB — COMPREHENSIVE METABOLIC PANEL
AST: 14 U/L (ref 0–37)
Albumin: 4.6 g/dL (ref 3.5–5.2)
BUN: 8 mg/dL (ref 6–23)
CO2: 30 mEq/L (ref 19–32)
Calcium: 9.6 mg/dL (ref 8.4–10.5)
Chloride: 104 mEq/L (ref 96–112)
Creat: 0.8 mg/dL (ref 0.50–1.35)
Glucose, Bld: 76 mg/dL (ref 70–99)

## 2013-11-17 LAB — POCT CBC
HCT, POC: 44.2 % (ref 43.5–53.7)
Hemoglobin: 13.8 g/dL — AB (ref 14.1–18.1)
Lymph, poc: 2.4 (ref 0.6–3.4)
MCH, POC: 29.4 pg (ref 27–31.2)
MCHC: 31.2 g/dL — AB (ref 31.8–35.4)
MCV: 94 fL (ref 80–97)
MID (cbc): 0.5 (ref 0–0.9)
POC LYMPH PERCENT: 31.5 %L (ref 10–50)
Platelet Count, POC: 194 10*3/uL (ref 142–424)
RBC: 4.7 M/uL (ref 4.69–6.13)
RDW, POC: 14.6 %
WBC: 7.5 10*3/uL (ref 4.6–10.2)

## 2013-11-17 LAB — TSH: TSH: 1.164 u[IU]/mL (ref 0.350–4.500)

## 2013-11-17 MED ORDER — HYDROCOD POLST-CHLORPHEN POLST 10-8 MG/5ML PO LQCR: 5.0000 mL | Freq: Two times a day (BID) | ORAL | Status: DC | PRN

## 2013-11-17 NOTE — Patient Instructions (Signed)
Please go to [facility] now for your CT scan.  You can ask for directions to the radiology department and let them know you are there for a CT scan.  You do not have to be seen in the ER    I will call you this evening with your CT results.  Please keep your phone with you and consider setting up your voice mail

## 2013-11-17 NOTE — Progress Notes (Signed)
[facility] [address], [place] [address] [phone]  Date:  11/17/2013   Name:  Alexis West   DOB:  Apr 20, 1976   MRN:  [id number]  PCP:  Lucilla Edin, MD    Chief Complaint: Chest Pain   History of Present Illness:  Alexis West is a 37 y.o. very pleasant male patient who presents with the following:  He was here just over a week ago with cough for several weeks and weight loss.  He was supposed to come back and have labs drawn but did not come back until today  He states he came back today due to persistent cough.  He is going to have a CT but does not know when this will be yet.  Admits he has had his phone off for several days at a time and does not have a VM account set up.   He states that he did not return to have his PPD read because it "went away and nothing showed back up" so he figured it was negative.    He has been coughing up blood on occasion, and last coughed up blood earlier today.   He also notes that his chest "feels real tight." this has gone on for about 2 months.  He felt that it was worse today- maybe due to the cold.  He will occasionally wheeze. "the cough syrup is helping me more than anything  Wt Readings from Last 3 Encounters:  11/17/13 156 lb (70.761 kg)  11/08/13 157 lb 12.8 oz (71.578 kg)  10/22/13 160 lb 9.6 oz (72.848 kg)   He states he has lost 80 lbs over the last year.    Patient Active Problem List   Diagnosis Date Noted  . Smoker 10/22/2013  . Anxiety state, unspecified 10/22/2013    Past Medical History  Diagnosis Date  . Hypertension   . Seizures   . Panic attack   . Chronic back pain     No past surgical history on file.  History  Substance Use Topics  . Smoking status: Current Every Day Smoker -- 1.00 packs/day for 14 years    Types: Cigarettes  . Smokeless tobacco: Not on file  . Alcohol Use: No    No family history on file.  Allergies  Allergen Reactions  . Morphine And  Related     blisters  . Penicillins     Nausea, vomiting, nose bleeds    Medication list has been reviewed and updated.  Current Outpatient Prescriptions on File Prior to Visit  Medication Sig Dispense Refill  . acetaminophen (TYLENOL) 500 MG tablet Take 1,000 mg by mouth every 6 (six) hours as needed for pain.       Marland Kitchen albuterol (PROVENTIL HFA;VENTOLIN HFA) 108 (90 BASE) MCG/ACT inhaler Inhale 2 puffs into the lungs every 6 (six) hours as needed for wheezing.      Marland Kitchen ALPRAZolam (XANAX) 1 MG tablet Take 1 mg by mouth at bedtime as needed for sleep.      . chlorpheniramine-HYDROcodone (TUSSIONEX PENNKINETIC ER) 10-8 MG/5ML LQCR Take 5 mLs by mouth every 12 (twelve) hours as needed for cough (cough).  115 mL  0  . doxycycline (DORYX) 100 MG EC tablet Take 100 mg by mouth 2 (two) times daily.      Marland Kitchen HYDROcodone-homatropine (HYCODAN) 5-1.5 MG/5ML syrup Take 5 mLs by mouth every 6 (six) hours as needed for cough.  120 mL  0  .  naproxen sodium (ANAPROX) 220 MG tablet Take 440 mg by mouth as needed (fever).      . ondansetron (ZOFRAN) 4 MG tablet Take 1 tablet (4 mg total) by mouth every 6 (six) hours for nausea.  12 tablet  0  . oxymetazoline (AFRIN NASAL SPRAY) 0.05 % nasal spray Place 2 sprays into the nose 2 (two) times daily for 2 days and discontinue.  30 mL  0  . traZODone (DESYREL) 100 MG tablet Take 200 mg by mouth at bedtime.       No current facility-administered medications on file prior to visit.    Review of Systems:  As per HPI- otherwise negative.   Physical Examination: Filed Vitals:   11/17/13 1406  BP: 132/78  Pulse: 101  Temp: 98.9 F (37.2 C)  Resp: 17   Filed Vitals:   11/17/13 1406  Height: 5' 10.5" (1.791 m)  Weight: 156 lb (70.761 kg)   Body mass index is 22.06 kg/(m^2). Ideal Body Weight: Weight in (lb) to have BMI = 25: 176.4  GEN: WDWN, NAD, Non-toxic, A & O x 3 HEENT: Atraumatic, Normocephalic. Neck supple. No masses, No LAD. Ears and Nose: No  external deformity. CV: RRR, No M/G/R. No JVD. No thrill. No extra heart sounds. PULM: CTA B, no wheezes, crackles, rhonchi. No retractions. No resp. distress. No accessory muscle use. ABD: S, NT, ND, +BS. No rebound. No HSM. EXTR: No c/c/e NEURO Normal gait.  PSYCH: Normally interactive. Conversant. Not depressed or anxious appearing.  Calm demeanor.   EKG: NSR, no ST elevation or depression, rate 75 BPM  Results for orders placed in visit on 11/17/13  POCT CBC      Result Value Range   WBC 7.5  4.6 - 10.2 K/uL   Lymph, poc 2.4  0.6 - 3.4   POC LYMPH PERCENT 31.5  10 - 50 %L   MID (cbc) 0.5  0 - 0.9   POC MID % 6.1  0 - 12 %M   POC Granulocyte 4.7  2 - 6.9   Granulocyte percent 62.4  37 - 80 %G   RBC 4.70  4.69 - 6.13 M/uL   Hemoglobin 13.8 (*) 14.1 - 18.1 g/dL   HCT, POC 81.1  91.4 - 53.7 %   MCV 94.0  80 - 97 fL   MCH, POC 29.4  27 - 31.2 pg   MCHC 31.2 (*) 31.8 - 35.4 g/dL   RDW, POC 78.2     Platelet Count, POC 194  142 - 424 K/uL   MPV 10.5  0 - 99.8 fL    Assessment and Plan: Cough - Plan: chlorpheniramine-HYDROcodone (TUSSIONEX PENNKINETIC ER) 10-8 MG/5ML LQCR  Loss of weight - Plan: TSH, HIV antibody, Comprehensive metabolic panel, POCT CBC  Chest pain - Plan: EKG 12-Lead  [name] has had a cough for several months and weight loss.  He had not yet heard about his CT appt due to communication difficulties (would not always be able to answer phone and no VM, no mychart).  Were able to get his Ct done today; will call with result.  Await labs as above.  Suspect Cp is due to cough, EKG is reassuring.    Signed Abbe Amsterdam, MD  CT CHEST WITHOUT CONTRAST  TECHNIQUE:  Multidetector CT imaging of the chest was performed following the  standard protocol without IV contrast.  COMPARISON: Chest radiograph November 08, 2013  FINDINGS:  There is mild symmetric apical pleural thickening and scarring. On  axial slice 11 and coronal slice 59, there is a 6 mm nodular  opacity  in the right apex. No other parenchymal nodular opacity is seen.  Also on axial slice 13, there is a 2 mm nodular opacity in the  anterior segment right upper lobe near the apex.  Elsewhere lungs are clear. There is no edema or consolidation.  The there is no appreciable thoracic adenopathy. There is slight  pericardial thickening anteriorly.  Visualized upper abdominal structures appear normal. There are no  blastic or lytic bone lesions. Thyroid appears normal except for a  single subcentimeter nodule in the right lobe measuring 5 mm.  IMPRESSION:  Mild apical scarring bilaterally. There is a 6 mm nodular opacity  with a nearby 2 mm nodular opacity in the right upper lobe/ apex  region. Followup of this nodular opacity should be based on  Fleischner Society guidelines.  No edema or consolidation.  No adenopathy.  Probable minimal pericardial fluid anteriorly.  Subcentimeter thyroid nodular opacity of questionable clinical  significance.  If the patient is at high risk for bronchogenic carcinoma, follow-up  chest CT at 6-12 months is recommended. If the patient is at low  risk for bronchogenic carcinoma, follow-up chest CT at 12 months is  recommended. This recommendation follows the consensus statement:  Guidelines for Management of Small Pulmonary Nodules Detected on CT  Scans: A Statement from the Fleischner Society as published in  Radiology 2005;237:395-400.   Received CT and spoke with him on evening of 11/19.  Explained that his lungs are likely ok although we do need to do a repeat CT in 6 months.  He also has a thyroid nodule that will need follow-up.  He states he has actually noted some "swelling of my thyroid" and he is quite concerned about this.  Advised that I will call him with his TSH and get a thyroid ultrasound set up for him   addnd for 11/20: called to go over labs.  Let him know that his labs look ok.  He then stated that "I want this thing in my chest  out, if it turns out to be cancer I am going to sue someone."  Stated that I cannot guarantee that anyone is going to want to operate on him, but that I am glad to send him for a surgical evaluation if he would like.  I will also set up a thyroid ultrasound for him   Results for orders placed in visit on 11/17/13  TSH      Result Value Range   TSH 1.164  0.350 - 4.500 uIU/mL  HIV ANTIBODY (ROUTINE TESTING)      Result Value Range   HIV NON REACTIVE  NON REACTIVE  COMPREHENSIVE METABOLIC PANEL      Result Value Range   Sodium 142  135 - 145 mEq/L   Potassium 3.9  3.5 - 5.3 mEq/L   Chloride 104  96 - 112 mEq/L   CO2 30  19 - 32 mEq/L   Glucose, Bld 76  70 - 99 mg/dL   BUN 8  6 - 23 mg/dL   Creat 1.61  0.96 - 0.45 mg/dL   Total Bilirubin 0.4  0.3 - 1.2 mg/dL   Alkaline Phosphatase 76  39 - 117 U/L   AST 14  0 - 37 U/L   ALT 10  0 - 53 U/L   Total Protein 6.3  6.0 - 8.3 g/dL   Albumin 4.6  3.5 - 5.2  g/dL   Calcium 9.6  8.4 - 16.1 mg/dL  POCT CBC      Result Value Range   WBC 7.5  4.6 - 10.2 K/uL   Lymph, poc 2.4  0.6 - 3.4   POC LYMPH PERCENT 31.5  10 - 50 %L   MID (cbc) 0.5  0 - 0.9   POC MID % 6.1  0 - 12 %M   POC Granulocyte 4.7  2 - 6.9   Granulocyte percent 62.4  37 - 80 %G   RBC 4.70  4.69 - 6.13 M/uL   Hemoglobin 13.8 (*) 14.1 - 18.1 g/dL   HCT, POC 09.6  04.5 - 53.7 %   MCV 94.0  80 - 97 fL   MCH, POC 29.4  27 - 31.2 pg   MCHC 31.2 (*) 31.8 - 35.4 g/dL   RDW, POC 40.9     Platelet Count, POC 194  142 - 424 K/uL   MPV 10.5  0 - 99.8 fL

## 2013-11-18 ENCOUNTER — Telehealth: Payer: Self-pay

## 2013-11-18 DIAGNOSIS — R911 Solitary pulmonary nodule: Secondary | ICD-10-CM

## 2013-11-18 LAB — HIV ANTIBODY (ROUTINE TESTING W REFLEX): HIV: NONREACTIVE

## 2013-11-18 NOTE — Telephone Encounter (Signed)
Patient states he does not want to wait 6 months for the scan, he wants to know if he can see someone to discuss having this area removed, please advise. He indicates his brother in law just passed away from cancer and he wants something done now.

## 2013-11-18 NOTE — Telephone Encounter (Signed)
Patient would like to talk to the doctor that saw him to discuss his diagnosis please call him at [phone]

## 2013-11-19 NOTE — Addendum Note (Signed)
Addended by: Abbe Amsterdam C on: 11/19/2013 06:52 PM   Modules accepted: Orders

## 2013-11-21 ENCOUNTER — Ambulatory Visit (INDEPENDENT_AMBULATORY_CARE_PROVIDER_SITE_OTHER): Payer: BC Managed Care – PPO | Admitting: Family Medicine

## 2013-11-21 ENCOUNTER — Encounter: Payer: Self-pay | Admitting: Family Medicine

## 2013-11-21 ENCOUNTER — Telehealth: Payer: Self-pay

## 2013-11-21 VITALS — BP 118/72 | HR 92 | Temp 98.7°F | Resp 18 | Ht 69.75 in | Wt 154.8 lb

## 2013-11-21 DIAGNOSIS — R05 Cough: Secondary | ICD-10-CM

## 2013-11-21 DIAGNOSIS — IMO0001 Reserved for inherently not codable concepts without codable children: Secondary | ICD-10-CM

## 2013-11-21 DIAGNOSIS — Z111 Encounter for screening for respiratory tuberculosis: Secondary | ICD-10-CM

## 2013-11-21 DIAGNOSIS — M791 Myalgia, unspecified site: Secondary | ICD-10-CM

## 2013-11-21 DIAGNOSIS — R059 Cough, unspecified: Secondary | ICD-10-CM

## 2013-11-21 DIAGNOSIS — R634 Abnormal weight loss: Secondary | ICD-10-CM

## 2013-11-21 DIAGNOSIS — R61 Generalized hyperhidrosis: Secondary | ICD-10-CM

## 2013-11-21 MED ORDER — OXYCODONE-ACETAMINOPHEN 10-325 MG PO TABS: 1.0000 | ORAL_TABLET | Freq: Three times a day (TID) | ORAL | Status: DC | PRN

## 2013-11-21 MED ORDER — MAGIC MOUTHWASH W/LIDOCAINE: 10.0000 mL | Freq: Three times a day (TID) | ORAL | Status: DC | PRN

## 2013-11-21 NOTE — Telephone Encounter (Signed)
Pt states that he cannot hardly swallow and has not slept in 2 days, the cough med is not touching the pain and that he hurts all over.  Advised pt to come in.  Pt agreed and will come in.

## 2013-11-21 NOTE — Progress Notes (Addendum)
[facility]:  Office Visit  Chief Complaint:  Chief Complaint  Patient presents with  . Follow-up  . Cough    pt coughing up blood  . Thyroid Problem    swollen and hurting, unable to eat    HPI: Alexis West is a 37 y.o. male who is here due to pain in Right neck /neck pain,  unable to swallow.  Pt states he is staying on the BRAT diet to keep hydrated.  Pt feels he has pain all over, "in his bones" at rate of 10/10.   Due for appointments to be scheduled with surgeon for 2 spots in the R/lung and ultrasound of the thyroid throughHe states he has had weightloss, night sweat, class B sxs. He was here for  A TB test which he got done, but the PPD was never read since he could not come in. He states he has been coughing up white sputum and he coughs so hard that it makes him nauseated and he has some streaks of blood come up with the sputum but not clots. It is bright red. [facility]. He is very anxious about his dx and thinks it may be cancer, he has not told his family yet.   Recent CT  Scan  11/20/2013  FINDINGS:  There is mild symmetric apical pleural thickening and scarring. On  axial slice 11 and coronal slice 59, there is a 6 mm nodular opacity  in the right apex. No other parenchymal nodular opacity is seen.  Also on axial slice 13, there is a 2 mm nodular opacity in the  anterior segment right upper lobe near the apex.  Elsewhere lungs are clear. There is no edema or consolidation.  The there is no appreciable thoracic adenopathy. There is slight  pericardial thickening anteriorly.  Visualized upper abdominal structures appear normal. There are no  blastic or lytic bone lesions. Thyroid appears normal except for a  single subcentimeter nodule in the right lobe measuring 5 mm.  IMPRESSION:  Mild apical scarring bilaterally. There is a 6 mm nodular opacity  with a nearby 2 mm nodular opacity in the right upper lobe/ apex  region. Followup of this nodular opacity  should be based on  Fleischner Society guidelines.  No edema or consolidation.  No adenopathy.  Probable minimal pericardial fluid anteriorly.  Subcentimeter thyroid nodular opacity of questionable clinical  significance.  Past Medical History  Diagnosis Date  . Hypertension   . Seizures   . Panic attack   . Chronic back pain    History reviewed. No pertinent past surgical history. History   Social History  . Marital Status: Legally Separated    Spouse Name: N/A    Number of Children: N/A  . Years of Education: N/A   Social History Main Topics  . Smoking status: Current Every Day Smoker -- 1.00 packs/day for 14 years    Types: Cigarettes  . Smokeless tobacco: None  . Alcohol Use: No  . Drug Use: None  . Sexual Activity: None   Other Topics Concern  . None   Social History Narrative  . None   History reviewed. No pertinent family history. Allergies  Allergen Reactions  . Morphine And Related     blisters  . Penicillins     Nausea, vomiting, nose bleeds   Prior to Admission medications   Medication Sig Start Date End Date Taking? Authorizing Provider  albuterol (PROVENTIL HFA;VENTOLIN HFA) 108 (90 BASE) MCG/ACT inhaler Inhale 2  puffs into the lungs every 6 (six) hours as needed for wheezing.   Yes Historical Provider, MD  ALPRAZolam Prudy Feeler) 1 MG tablet Take 1 mg by mouth at bedtime as needed for sleep.   Yes Historical Provider, MD  chlorpheniramine-HYDROcodone (TUSSIONEX PENNKINETIC ER) 10-8 MG/5ML LQCR Take 5 mLs by mouth every 12 (twelve) hours as needed for cough (cough). 11/17/13  Yes Gwenlyn Found Copland, MD  doxycycline (DORYX) 100 MG EC tablet Take 100 mg by mouth 2 (two) times daily.   Yes Historical Provider, MD  naproxen sodium (ANAPROX) 220 MG tablet Take 440 mg by mouth as needed (fever).   Yes Historical Provider, MD  ondansetron (ZOFRAN) 4 MG tablet Take 1 tablet (4 mg total) by mouth every 6 (six) hours for nausea. 10/26/13  Yes Arthor Captain, PA-C   traZODone (DESYREL) 100 MG tablet Take 200 mg by mouth at bedtime.   Yes Historical Provider, MD  acetaminophen (TYLENOL) 500 MG tablet Take 1,000 mg by mouth every 6 (six) hours as needed for pain.     Historical Provider, MD  oxymetazoline (AFRIN NASAL SPRAY) 0.05 % nasal spray Place 2 sprays into the nose 2 (two) times daily for 2 days and discontinue. 10/26/13   Arthor Captain, PA-C     ROS: The patient denies fevers, chills,  chest pain, palpitations, wheezing, dyspnea on exertion,  vomiting, abdominal pain, dysuria, hematuria, melena, numbness,  or tingling.   All other systems have been reviewed and were otherwise negative with the exception of those mentioned in the HPI and as above.    PHYSICAL EXAM: Filed Vitals:   11/21/13 1133  BP: 118/72  Pulse: 92  Temp: 98.7 F (37.1 C)  Resp: 18   Filed Vitals:   11/21/13 1133  Height: 5' 9.75" (1.772 m)  Weight: 154 lb 12.8 oz (70.217 kg)  Spo2 97% Body mass index is 22.36 kg/(m^2).  General: Alert, no acute distress HEENT:  Normocephalic, atraumatic, oropharynx patent. EOMI, PERRLA Cardiovascular:  Regular rate and rhythm, no rubs murmurs or gallops.  No Carotid bruits, radial pulse intact. No pedal edema.  Respiratory: Clear to auscultation bilaterally.  No wheezes, rales, or rhonchi.  No cyanosis, no use of accessory musculature GI: No organomegaly, abdomen is soft and non-tender, positive bowel sounds.  No masses. Skin: No rashes. Neurologic: Facial musculature symmetric. Psychiatric: Patient is appropriate throughout our interaction. Lymphatic: No cervical lymphadenopathy Musculoskeletal: Gait intact.   LABS: Results for orders placed in visit on 11/21/13  AFB CULTURE WITH SMEAR      Result Value Range   Preliminary Report Culture will be examined for 6 weeks before      Preliminary Report issuing a Final Report.     ACID FAST SMEAR No Acid Fast Bacilli Seen       EKG/XRAY:   Primary read interpreted by Dr. Conley Rolls  at [facility].   ASSESSMENT/PLAN: Encounter Diagnoses  Name Primary?  . Screening examination for pulmonary tuberculosis Yes  . Cough   . Loss of weight   . Night sweats   . Myalgia    37 year old male with a history of tobacco use who was recently evaluated here for weight loss and incidental  Lung nodules and thyroid nodule found on CT scan . He has been having problems swallowing and has been coughing so much to the point he is having throat pain and also producing streaky red blood tinged sputum. Labs were unremarkable from 11/20/2013 CBC, CMP, TSH There is a large  anxiety component but he says the Alprazolam helps, and the trazadone helps with sleep.  He is being referred to general surgery for further workup of  Incidental pulmonary nodules on CT scan  He is supposed to get a thyroid scan as well for incidental thyroid nodule on CT scan Magic mouthwash and also percocet rx ( he state the percocet 10 help him better with pain control then anything else, he denies abuse issues) . This will be a one time thing from me until he gets his CT scan and referral for pulmonary nodules evaluated.  Continue with BRAT diet. We will not place another PPD since he may or maynot show up for his reading, will get an AFB sputum and smear test to rule out TB F/u if he does not hear from [place] in 2 days about referrals    Gross sideeffects, risk and benefits, and alternatives of medications d/w patient. Patient is aware that all medications have potential sideeffects and we are unable to predict every sideeffect or drug-drug interaction that may occur.  Hamilton Capri PHUONG, DO 11/22/2013 4:43 PM

## 2013-11-21 NOTE — Telephone Encounter (Signed)
Patient called stated he seen Dr. Jeananne Rama to schedule patient to see surgeon. Alexis West stated he haven't heard from anyone. He is in pain and in need of pain medication. Patient also states he is having the shakes and he is coughing up blood. Pharmacy if need [facility] on [address]

## 2013-11-22 ENCOUNTER — Telehealth: Payer: Self-pay | Admitting: Radiology

## 2013-11-22 NOTE — Telephone Encounter (Signed)
Spoke to pharmacy regarding sig on the magic mouthwash.

## 2013-11-23 ENCOUNTER — Telehealth: Payer: Self-pay

## 2013-11-23 DIAGNOSIS — R110 Nausea: Secondary | ICD-10-CM

## 2013-11-23 NOTE — Telephone Encounter (Signed)
Mr Villena is very concerned that he has not been set up for his referrals(I am giving a message to Lupita Leash in referrals) but is having extreme nausea and Needs Rx called to his pharmacy.  Best phone for pt is [phone]   Pharmacy [facility] in [place]

## 2013-11-23 NOTE — Telephone Encounter (Signed)
   PT HAS AN APPT ON 12/03/13 WITH DR MCQUAID AND LETTER SENT ON 11/22/13      Nausea seems to be new, please advise.

## 2013-11-24 ENCOUNTER — Ambulatory Visit
Admission: RE | Admit: 2013-11-24 | Discharge: 2013-11-24 | Disposition: A | Discharge status: Home / Self Care | Payer: BC Managed Care – PPO | Source: Ambulatory Visit | Attending: Family Medicine | Admitting: Family Medicine

## 2013-11-24 DIAGNOSIS — E041 Nontoxic single thyroid nodule: Secondary | ICD-10-CM

## 2013-11-24 MED ORDER — ONDANSETRON HCL 4 MG PO TABS: ORAL_TABLET | ORAL | Status: DC

## 2013-11-24 NOTE — Telephone Encounter (Signed)
Left message for him to call me back. He should return if his symptoms are worsening.

## 2013-11-24 NOTE — Telephone Encounter (Signed)
Left message to return call. RX called in to [facility]

## 2013-11-26 ENCOUNTER — Telehealth: Payer: Self-pay

## 2013-11-26 DIAGNOSIS — R1319 Other dysphagia: Secondary | ICD-10-CM

## 2013-11-26 DIAGNOSIS — R110 Nausea: Secondary | ICD-10-CM

## 2013-11-26 MED ORDER — ENSURE COMPLETE PO LIQD: 237.0000 mL | Freq: Two times a day (BID) | ORAL | Status: DC

## 2013-11-26 MED ORDER — ONDANSETRON HCL 4 MG PO TABS: ORAL_TABLET | ORAL | Status: DC

## 2013-11-26 NOTE — Telephone Encounter (Signed)
Called him to discuss.  His thyroid ultrasound is normal.  He states the did not get the zofran that Dr. Conley Rolls rx for him 2 days ago.  He would like this sent to [facility] [place].  Also, he states that he cannot swallow anything solid and is living on mashed foods.  This seems to have caused him to lose 5o lbs over an undetermined time period.  Would like me to rx some ensure for him.  He had not mentioned this issue at our most recent visit so I was not aware.  Will also refer to GI  Wt Readings from Last 3 Encounters:  11/21/13 154 lb 12.8 oz (70.217 kg)  11/17/13 156 lb (70.761 kg)  11/08/13 157 lb 12.8 oz (71.578 kg)

## 2013-11-26 NOTE — Telephone Encounter (Signed)
Patient calling to get his MRI results. Also, states that he has an appointment with [facility] (patient sounded really unsure about the name) and wants to know what time. Last, patient needs something to help with his nausea. He cannot eat solid foods due to his thyroid issues. States he has only been able to eat applesause, yogurt, and banana.[facility] or [facility] on [address] in [place].  [phone]

## 2013-11-28 NOTE — Telephone Encounter (Signed)
LMOM to RTC if symptoms worsening.

## 2013-11-29 ENCOUNTER — Ambulatory Visit (INDEPENDENT_AMBULATORY_CARE_PROVIDER_SITE_OTHER): Payer: BC Managed Care – PPO | Admitting: Family Medicine

## 2013-11-29 ENCOUNTER — Telehealth: Payer: Self-pay | Admitting: Radiology

## 2013-11-29 ENCOUNTER — Ambulatory Visit: Payer: BC Managed Care – PPO

## 2013-11-29 VITALS — BP 130/78 | HR 96 | Temp 99.4°F | Resp 18 | Wt 151.0 lb

## 2013-11-29 DIAGNOSIS — R1319 Other dysphagia: Secondary | ICD-10-CM

## 2013-11-29 DIAGNOSIS — R042 Hemoptysis: Secondary | ICD-10-CM

## 2013-11-29 DIAGNOSIS — R059 Cough, unspecified: Secondary | ICD-10-CM

## 2013-11-29 DIAGNOSIS — R634 Abnormal weight loss: Secondary | ICD-10-CM

## 2013-11-29 DIAGNOSIS — R05 Cough: Secondary | ICD-10-CM

## 2013-11-29 DIAGNOSIS — R112 Nausea with vomiting, unspecified: Secondary | ICD-10-CM

## 2013-11-29 LAB — POCT CBC
Granulocyte percent: 76.8 % (ref 37–80)
HCT, POC: 46.8 % (ref 43.5–53.7)
Hemoglobin: 15.1 g/dL (ref 14.1–18.1)
Lymph, poc: 2.2 (ref 0.6–3.4)
MCH, POC: 30.4 pg (ref 27–31.2)
MCHC: 32.3 g/dL (ref 31.8–35.4)
MCV: 94.1 fL (ref 80–97)
MID (cbc): 0.6 (ref 0–0.9)
MPV: 10.2 fL (ref 0–99.8)
POC Granulocyte: 9.1 — AB (ref 2–6.9)
POC LYMPH PERCENT: 18.2 % (ref 10–50)
POC MID %: 5 % (ref 0–12)
Platelet Count, POC: 264 K/uL (ref 142–424)
RBC: 4.97 M/uL (ref 4.69–6.13)
RDW, POC: 13.5 %
WBC: 11.9 K/uL — AB (ref 4.6–10.2)

## 2013-11-29 LAB — IFOBT (OCCULT BLOOD): IFOBT: NEGATIVE

## 2013-11-29 MED ORDER — ONDANSETRON 4 MG PO TBDP: 16.0000 mg | ORAL_TABLET | Freq: Once | ORAL | Status: DC

## 2013-11-29 MED ORDER — ENSURE COMPLETE PO LIQD: 237.0000 mL | Freq: Two times a day (BID) | ORAL | Status: DC

## 2013-11-29 MED ORDER — HYDROCOD POLST-CHLORPHEN POLST 10-8 MG/5ML PO LQCR: 5.0000 mL | Freq: Two times a day (BID) | ORAL | Status: DC | PRN

## 2013-11-29 NOTE — Telephone Encounter (Signed)
Printed, faxed to [facility]

## 2013-11-29 NOTE — Progress Notes (Signed)
[facility] [address], [place] [address] [phone]  Date:  11/29/2013   Name:  Alexis West   DOB:  08-10-1976   MRN:  [id number]  PCP:  Lucilla Edin, MD    Chief Complaint: Emesis   History of Present Illness:  Alexis West is a 37 y.o. very pleasant male patient who presents with the following:  He is here again with cough and coughing/ vomiting up blood. He notes that if he is active he will cough more and sometimes bring up blood.   He feels that things are getting stuck in the right side of his throat when he will swallow.   He coughed up blood can be dark or bright red.  He does not cough up coffee grounds.   He has not seen any blood in his stools or any dark tarry stools.    His appt with pulmonary is on Friday. "My whole body hurts. Even my legs and everything."  He has not been able to take his abx or pain medication (doxy and percocet) because he cannot swallow them.  Even if he breaks them up he cannot swallow them.  "the pieces hurt too much.  He is urinating ok.   He felt slightly feverish at home today.    He has not been able to swallow solids for about 2 weeks.  He is able to swallow liquids without difficulty and he is drinking ensure.  He does not have any difficulty breathing.   He was given an rx for percocet per Dr. Conley Rolls on 11/23; however he was not able to swallow these so "I threw then away"  He notes an overall weight loss of ?80 lbs over the last year.  He showed me a photo of himself from about one year ago when he was very  muscular.  He denies having used steroids at that time.    Ppd was placed but he did not have it read.  AFB is pending.  HIV negative  Wt Readings from Last 3 Encounters:  11/29/13 151 lb (68.493 kg)  11/21/13 154 lb 12.8 oz (70.217 kg)  11/17/13 156 lb (70.761 kg)     Patient Active Problem List   Diagnosis Date Noted  . Smoker 10/22/2013  . Anxiety state, unspecified 10/22/2013     Past Medical History  Diagnosis Date  . Hypertension   . Seizures   . Panic attack   . Chronic back pain     No past surgical history on file.  History  Substance Use Topics  . Smoking status: Current Every Day Smoker -- 1.00 packs/day for 14 years    Types: Cigarettes  . Smokeless tobacco: Not on file  . Alcohol Use: No    No family history on file.  Allergies  Allergen Reactions  . Morphine And Related     blisters  . Penicillins     Nausea, vomiting, nose bleeds    Medication list has been reviewed and updated.  Current Outpatient Prescriptions on File Prior to Visit  Medication Sig Dispense Refill  . acetaminophen (TYLENOL) 500 MG tablet Take 1,000 mg by mouth every 6 (six) hours as needed for pain.       Marland Kitchen albuterol (PROVENTIL HFA;VENTOLIN HFA) 108 (90 BASE) MCG/ACT inhaler Inhale 2 puffs into the lungs every 6 (six) hours as needed for wheezing.      Marland Kitchen ALPRAZolam (XANAX) 1 MG tablet Take 1 mg by mouth at  bedtime as needed for sleep.      Marland Kitchen Alum & Mag Hydroxide-Simeth (MAGIC MOUTHWASH W/LIDOCAINE) SOLN Take 10 mLs by mouth 3 (three) times daily as needed for mouth pain.  180 mL  0  . chlorpheniramine-HYDROcodone (TUSSIONEX PENNKINETIC ER) 10-8 MG/5ML LQCR Take 5 mLs by mouth every 12 (twelve) hours as needed for cough (cough).  115 mL  0  . doxycycline (DORYX) 100 MG EC tablet Take 100 mg by mouth 2 (two) times daily.      . feeding supplement, ENSURE COMPLETE, (ENSURE COMPLETE) LIQD Take 237 mLs by mouth 2 (two) times daily between meals.  30 Bottle  6  . naproxen sodium (ANAPROX) 220 MG tablet Take 440 mg by mouth as needed (fever).      . ondansetron (ZOFRAN) 4 MG tablet Take 1 tablet (4 mg total) by mouth every 8 (eight) hours for nausea.  30 tablet  0  . oxyCODONE-acetaminophen (PERCOCET) 10-325 MG per tablet Take 1 tablet by mouth every 8 (eight) hours as needed for pain.  60 tablet  0  . oxymetazoline (AFRIN NASAL SPRAY) 0.05 % nasal spray Place 2 sprays  into the nose 2 (two) times daily for 2 days and discontinue.  30 mL  0  . traZODone (DESYREL) 100 MG tablet Take 200 mg by mouth at bedtime.       No current facility-administered medications on file prior to visit.    Review of Systems:  As per HPI- otherwise negative.   Physical Examination: Filed Vitals:   11/29/13 1325  BP: 130/78  Pulse: 114  Temp: 99.4 F (37.4 C)  Resp: 18   Filed Vitals:   11/29/13 1325  Weight: 151 lb (68.493 kg)   Body mass index is 21.81 kg/(m^2). Ideal Body Weight:    GEN: WDWN, NAD, Non-toxic, A & O x 3, slim build HEENT: Atraumatic, Normocephalic. Neck supple. No masses, No LAD.  Bilateral TM wnl, oropharynx normal.  PEERL,EOMI.   No sign of angioedema Ears and Nose: No external deformity. CV: RRR, No M/G/R. No JVD. No thrill. No extra heart sounds. PULM: CTA B, no wheezes, crackles, rhonchi. No retractions. No resp. distress. No accessory muscle use. ABD: S, NT, ND, +BS. No rebound. No HSM. EXTR: No c/c/e NEURO Normal gait.  PSYCH: Normally interactive. Conversant. Not depressed or anxious appearing.  Calm demeanor.  Rectal: no gross blood He did cough up a small amount of bright red blood in a tissue while in clinic a couple of times.   [facility] reading (PRIMARY) by  Dr. Golda Acre. CXR: negative CHEST 2 VIEW  COMPARISON: November 08, 2013.  FINDINGS: Cardiomediastinal silhouette appears normal. Mild hyperexpansion of the lungs is noted suggesting chronic obstructive pulmonary disease. No pleural effusion or pneumothorax is noted. Stable bilateral apical thickening is noted. No acute pulmonary disease is noted. Bony thorax is intact.  IMPRESSION: No acute cardiopulmonary abnormality seen.  Results for orders placed in visit on 11/29/13  POCT CBC      Result Value Range   WBC 11.9 (*) 4.6 - 10.2 K/uL   Lymph, poc 2.2  0.6 - 3.4   POC LYMPH PERCENT 18.2  10 - 50 %L   MID (cbc) 0.6  0 - 0.9   POC MID % 5.0  0 - 12 %M   POC  Granulocyte 9.1 (*) 2 - 6.9   Granulocyte percent 76.8  37 - 80 %G   RBC 4.97  4.69 - 6.13 M/uL   Hemoglobin 15.1  14.1 - 18.1 g/dL   HCT, POC 16.1  09.6 - 53.7 %   MCV 94.1  80 - 97 fL   MCH, POC 30.4  27 - 31.2 pg   MCHC 32.3  31.8 - 35.4 g/dL   RDW, POC 04.5     Platelet Count, POC 264  142 - 424 K/uL   MPV 10.2  0 - 99.8 fL     Assessment and Plan: Cough - Plan: DG Chest 2 View, chlorpheniramine-HYDROcodone (TUSSIONEX PENNKINETIC ER) 10-8 MG/5ML LQCR  Hemoptysis - Plan: POCT CBC, IFOBT POC (occult bld, rslt in office)  Weight loss, abnormal  Nausea with vomiting - Plan: ondansetron (ZOFRAN-ODT) disintegrating tablet 16 mg  Jayzen is here again with sx consisting of persistent cough, dramatic weight loss, coughing up blood and now more prominent difficulty swallowing solids.  He is seeing pulmonology on Friday.  Made appt with Gi tomorrow. Appreciate consultation by [place] concerning these issues. Defer decision regarding upper GI and/ or bronchoscopy to these consultants.   given #4 zofran ODT tablets to take as needed for nausea. He has not been able to swallow pills well and the ODT are expensive Gave a refill of tussionex to use prn for cough and pain.  He states he discarded the percocet he was given recently as he could not swallow them    See patient instructions for more details.    Signed Abbe Amsterdam, MD

## 2013-11-29 NOTE — Patient Instructions (Addendum)
You will be seen tomorrow at 11 am by Dr. Christella Hartigan at [facility]  [facility] Main Office [address] [place] . [place], [place] Ph [phone]  They will be able to see our records from your visits here and your labs.   In the meantime please continue to hydrate.  You may use the tussionex as needed for pain and cough. Remember it can cause drowsiness.  Use the zofran dissolvable tablets as needed for nausea.  You can take one every 8 hours.    If you are no longer able to swallow liquids or have any trouble breathing please go to the nearest ER right away.

## 2013-11-29 NOTE — Telephone Encounter (Signed)
Patient reports coughing up blood, and vomiting up blood today, advised him to return now, he will come in. To you FYI

## 2013-11-29 NOTE — Telephone Encounter (Signed)
Patient was seen today for vomiting blood. States he got an RX for Ensure and was told that he can this from only 2 places. Patient is currently at [facility] needing authorization to get this filled. [facility] phone [phone]; fax [phone]

## 2013-11-30 ENCOUNTER — Telehealth: Payer: Self-pay

## 2013-11-30 ENCOUNTER — Ambulatory Visit: Payer: BC Managed Care – PPO | Admitting: Gastroenterology

## 2013-11-30 NOTE — Telephone Encounter (Signed)
Do you know anyone in [place] to refer?

## 2013-11-30 NOTE — Telephone Encounter (Signed)
Patient called to reschedule gastro referral to a sooner date; he was late to his [place] gastro appt, because he went to the wrong [place] location and he was denied to be seen because he was 14 mins late. He was reschedule by them for the 12/14/13 but patient reported to their office that he cannot wait that long because everyday he keeps vomiting blood and cannot eat. Patient is very concerned that he is not able to be seen sooner. He ask if we can refer him closer to home [place]. Please advise. Patient states he wants to be seen this Friday and preferably in [place].   [phone]

## 2013-12-03 ENCOUNTER — Institutional Professional Consult (permissible substitution): Payer: BC Managed Care – PPO | Admitting: Pulmonary Disease

## 2013-12-05 ENCOUNTER — Ambulatory Visit (INDEPENDENT_AMBULATORY_CARE_PROVIDER_SITE_OTHER): Payer: BC Managed Care – PPO | Admitting: Family Medicine

## 2013-12-05 VITALS — BP 92/70 | HR 96 | Temp 98.6°F | Resp 18 | Wt 146.0 lb

## 2013-12-05 DIAGNOSIS — R05 Cough: Secondary | ICD-10-CM

## 2013-12-05 DIAGNOSIS — Z8669 Personal history of other diseases of the nervous system and sense organs: Secondary | ICD-10-CM

## 2013-12-05 DIAGNOSIS — R112 Nausea with vomiting, unspecified: Secondary | ICD-10-CM

## 2013-12-05 DIAGNOSIS — R1310 Dysphagia, unspecified: Secondary | ICD-10-CM

## 2013-12-05 DIAGNOSIS — R042 Hemoptysis: Secondary | ICD-10-CM

## 2013-12-05 DIAGNOSIS — Z87898 Personal history of other specified conditions: Secondary | ICD-10-CM

## 2013-12-05 DIAGNOSIS — R509 Fever, unspecified: Secondary | ICD-10-CM

## 2013-12-05 DIAGNOSIS — R059 Cough, unspecified: Secondary | ICD-10-CM

## 2013-12-05 LAB — POCT CBC
Granulocyte percent: 70.5 % (ref 37–80)
HCT, POC: 49 % (ref 43.5–53.7)
Hemoglobin: 15.9 g/dL (ref 14.1–18.1)
Lymph, poc: 2.3 (ref 0.6–3.4)
MCH, POC: 30.2 pg (ref 27–31.2)
MCHC: 32.4 g/dL (ref 31.8–35.4)
MCV: 93.2 fL (ref 80–97)
MID (cbc): 0.5 (ref 0–0.9)
MPV: 9.4 fL (ref 0–99.8)
POC Granulocyte: 6.8 (ref 2–6.9)
POC LYMPH PERCENT: 24.2 % (ref 10–50)
POC MID %: 5.3 % (ref 0–12)
Platelet Count, POC: 288 10*3/uL (ref 142–424)
RBC: 5.26 M/uL (ref 4.69–6.13)
RDW, POC: 13.5 %
WBC: 9.7 10*3/uL (ref 4.6–10.2)

## 2013-12-05 MED ORDER — ALBUTEROL SULFATE HFA 108 (90 BASE) MCG/ACT IN AERS: 2.0000 | INHALATION_SPRAY | Freq: Four times a day (QID) | RESPIRATORY_TRACT | Status: DC | PRN

## 2013-12-05 MED ORDER — ONDANSETRON 4 MG PO TBDP: 16.0000 mg | ORAL_TABLET | Freq: Once | ORAL | Status: DC

## 2013-12-05 MED ORDER — ONDANSETRON 4 MG PO TBDP: 4.0000 mg | ORAL_TABLET | Freq: Three times a day (TID) | ORAL | Status: DC | PRN

## 2013-12-05 MED ORDER — HYDROCOD POLST-CHLORPHEN POLST 10-8 MG/5ML PO LQCR: 5.0000 mL | Freq: Two times a day (BID) | ORAL | Status: DC | PRN

## 2013-12-05 NOTE — Telephone Encounter (Signed)
We have worked on getting him a different referral.  Spoke with him on the 2nd about this issue.  He stated that he was feeling bad: I urged him to come in if he was feeling ill.  He wanted something else for pain but stated that I could not rx anything else as he had recently had an rx for percocet as well as tussionex.  He stated that he had "thrown the percocet away." however he accepted that I was not able to wrist any other narcotics at that time
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
## 2013-12-06 ENCOUNTER — Telehealth: Payer: Self-pay | Admitting: Family Medicine

## 2013-12-06 NOTE — Telephone Encounter (Signed)
Pt has appt with [facility] on 12/14/13, per dr Conley Rolls sheduled appointment sooner with [facility], left detailed msg on VM for location, time, date 12/07/13@9 :45 am, pt called back, spoke to him he stated he could not afford the co-pay to be seen at visit. At this point dr Conley Rolls spoke to him making him aware that he would need to keep the appointment scheduled for 12/14/13, and she would not fill any prescriptions for pain or cough until he is seen at GI.

## 2013-12-06 NOTE — Telephone Encounter (Signed)
Called Dr. Ulyses Jarred office [facility])  Got Pt appointment tomorrow 12/07/13 at 3pm. Pt needs to arrive at 2:45pm for apt.  Called and notified Pt to arrive there at 2:45pm- even if he has a fever. Pt seems to verbally understand. Alexis West

## 2013-12-07 ENCOUNTER — Encounter: Payer: Self-pay | Admitting: Pulmonary Disease

## 2013-12-07 ENCOUNTER — Ambulatory Visit (INDEPENDENT_AMBULATORY_CARE_PROVIDER_SITE_OTHER): Payer: BC Managed Care – PPO | Admitting: Pulmonary Disease

## 2013-12-07 ENCOUNTER — Other Ambulatory Visit: Payer: BC Managed Care – PPO

## 2013-12-07 VITALS — BP 96/66 | HR 105 | Ht 70.5 in | Wt 153.0 lb

## 2013-12-07 DIAGNOSIS — R911 Solitary pulmonary nodule: Secondary | ICD-10-CM

## 2013-12-07 DIAGNOSIS — R059 Cough, unspecified: Secondary | ICD-10-CM | POA: Insufficient documentation

## 2013-12-07 DIAGNOSIS — R918 Other nonspecific abnormal finding of lung field: Secondary | ICD-10-CM | POA: Insufficient documentation

## 2013-12-07 DIAGNOSIS — R0602 Shortness of breath: Secondary | ICD-10-CM

## 2013-12-07 DIAGNOSIS — R05 Cough: Secondary | ICD-10-CM

## 2013-12-07 DIAGNOSIS — F17200 Nicotine dependence, unspecified, uncomplicated: Secondary | ICD-10-CM

## 2013-12-07 DIAGNOSIS — R042 Hemoptysis: Secondary | ICD-10-CM | POA: Insufficient documentation

## 2013-12-07 MED ORDER — TRAMADOL HCL 50 MG PO TABS: 50.0000 mg | ORAL_TABLET | Freq: Four times a day (QID) | ORAL | Status: DC | PRN

## 2013-12-07 NOTE — Assessment & Plan Note (Signed)
Encouraged at length to quit today.

## 2013-12-07 NOTE — Assessment & Plan Note (Addendum)
The constellation of Alexis West's hemoptysis, weight loss, and fever are worrisome for malignancy vs TB.  However, his essentially normal CT scan argue against this.    To date he has had a fairly extensive work up which has been relatively unrevealing.  The CT scan did not show any abnormalities which would explain his hemoptysis, but I think it would be reasonable to do a bronchoscopy to examine his airways given his smoking history.  Before we do this we will send two more sputum cultures to rule out active, infectious TB.  I think that the most likely explanation for his hemoptysis is airway inflammation/bronchitis from ongoing coughing.    He does not have COPD or asthma based on his lung function testing.  Plan: -repeat sputum AFB x2 -bronchoscopy week of 12/15 if AFB smears negative -cough suppressive therapy with tramadol, voice rest -if bronch negative, then may need rhinoscopy/ENT evaluation

## 2013-12-07 NOTE — Assessment & Plan Note (Signed)
Cough suppressive therapy with tramadol, nighttime tussionex (instructed not to use together or to use these and drive) and voice rest

## 2013-12-07 NOTE — Progress Notes (Signed)
Subjective:    Patient ID: Alexis West, male    DOB: January 06, 1976, 37 y.o.   MRN: [id number]  HPI  Alexis West is here because he had a bad case of bronchitis recently and he ended up having a CT chest which showed some pulmonary nodules.  He has lost 80 lbs in the last year in addition  He stated that recently he has been having a really nasty spell of bronchitis.  In the last few weeks he has had trouble with poor appetite, poor po intake, sore throat and trouble swallowing.  He fell a few weeks before all this started and landed on his back.  He fell about 12 feet onto his back.  He was given oxycodone for the pain.  Not long after that he started having trouble swallowing to the point that he couldn't swallow pills.  No sick contacts.  He has had fevers and chills intermittently during this time.  He has been treated by his PCP with Levaquin for a week, prednisone (which gives him bad headaches and trouble sleeping), and amoxicillin (which caused him nausea and vomiting).  He was also given an albuterol inhaler and a combivent inhaler, bot hof which helped with his shortness of breath and wheezing.  Cough has been a bad problem throughout this and he has had hemoptysis.  He states that the blood shows up after a long coughing spell.  Initialy he had dark brown sputum which later turned bloody.  He had some hemoptysis this morning.  He has had TB testing as well as HIV testing.  He thinks that the TB test was negative but he never had it read.  He has also had some nose bleeding in October 2014 which was fairly severe.    At one time he was smoking one pack per day.  He started at age 35 years old.  He quit in 2001 and then started again August 27 2012. He has been smoking as much as 1 and 1/2 packs per day in the last year.  He is now down to 1/2 pack per day.  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  He lifted weights a lot in the last see  He stated that his childhood was normal with the  exception of having pneumonia in the third grade which lead to him staying out of school for a month.   Past Medical History  Diagnosis Date  . Hypertension   . Seizures   . Panic attack   . Chronic back pain      No family history on file.   History   Social History  . Marital Status: Legally Separated    Spouse Name: N/A    Number of Children: N/A  . Years of Education: N/A   Occupational History  . Not on file.   Social History Main Topics  . Smoking status: Current Every Day Smoker -- 0.50 packs/day    Types: Cigarettes    Start date: 12/30/1988  . Smokeless tobacco: Never Used     Comment: Pt has smoked on and off since he was 13.  Pt has this time been smoking since 08/2012  . Alcohol Use: No  . Drug Use: No  . Sexual Activity: Not on file   Other Topics Concern  . Not on file   Social History Narrative  . No narrative on file     Allergies  Allergen Reactions  . Morphine And Related  blisters  . Penicillins     Nausea, vomiting, nose bleeds     Outpatient Prescriptions Prior to Visit  Medication Sig Dispense Refill  . acetaminophen (TYLENOL) 500 MG tablet Take 1,000 mg by mouth every 6 (six) hours as needed for pain.       Marland Kitchen albuterol (PROVENTIL HFA;VENTOLIN HFA) 108 (90 BASE) MCG/ACT inhaler Inhale 2 puffs into the lungs every 6 (six) hours as needed for wheezing.  1 Inhaler  0  . ALPRAZolam (XANAX) 1 MG tablet Take 1 mg by mouth at bedtime as needed for sleep.      . chlorpheniramine-HYDROcodone (TUSSIONEX PENNKINETIC ER) 10-8 MG/5ML LQCR Take 5 mLs by mouth every 12 (twelve) hours as needed for cough (cough).  120 mL  0  . feeding supplement, ENSURE COMPLETE, (ENSURE COMPLETE) LIQD Take 237 mLs by mouth 2 (two) times daily between meals.  30 Bottle  6  . naproxen sodium (ANAPROX) 220 MG tablet Take 440 mg by mouth as needed (fever).      . ondansetron (ZOFRAN ODT) 4 MG disintegrating tablet Take 1 tablet (4 mg total) by mouth every 8 (eight) hours  as needed for nausea or vomiting.  30 tablet  1  . traZODone (DESYREL) 100 MG tablet Take 200 mg by mouth at bedtime.      Marland Kitchen doxycycline (DORYX) 100 MG EC tablet Take 100 mg by mouth 2 (two) times daily.      . ondansetron (ZOFRAN) 4 MG tablet Take 1 tablet (4 mg total) by mouth every 8 (eight) hours for nausea.  30 tablet  0   Facility-Administered Medications Prior to Visit  Medication Dose Route Frequency Provider Last Rate Last Dose  . ondansetron (ZOFRAN-ODT) disintegrating tablet 16 mg  16 mg Oral Once Pearline Cables, MD          Review of Systems  Constitutional: Positive for fever. Negative for unexpected weight change.  HENT: Positive for congestion, sore throat and trouble swallowing. Negative for dental problem, ear pain, nosebleeds, postnasal drip, rhinorrhea, sinus pressure and sneezing.   Eyes: Negative for redness and itching.  Respiratory: Positive for cough, chest tightness, shortness of breath and wheezing.   Cardiovascular: Negative for palpitations and leg swelling.  Gastrointestinal: Positive for nausea. Negative for vomiting.  Genitourinary: Negative for dysuria.  Musculoskeletal: Negative for joint swelling.  Skin: Negative for rash.  Neurological: Negative for headaches.  Hematological: Does not bruise/bleed easily.  Psychiatric/Behavioral: Negative for dysphoric mood. The patient is not nervous/anxious.        Objective:   Physical Exam  Filed Vitals:   12/07/13 1504  BP: 96/66  Pulse: 105  Height: 5' 10.5" (1.791 m)  Weight: 153 lb (69.4 kg)  SpO2: 96%   RA  Gen: no acute distress HEENT: NCAT, PERRL, EOMi, OP clear, neck supple without masses PULM: CTA B CV: RRR, no mgr, no JVD AB: BS+, soft, nontender, no hsm Ext: warm, no edema, no clubbing, no cyanosis Derm: no rash or skin breakdown Neuro: A&Ox4, CN II-XII intact, strength 5/5 in all 4 extremities  10/2013 CT chest > 6mm and 2mm nodules in RUL, bilateral apical capping, no mediastinal  lymphadenopathy  12/07/2013 Simple spirometry > normal      Assessment & Plan:   Hemoptysis, unspecified The constellation of [name]'s hemoptysis, weight loss, and fever are worrisome for malignancy vs TB.  However, his essentially normal CT scan argue against this.    To date he has had a fairly extensive  work up which has been relatively unrevealing.  The CT scan did not show any abnormalities which would explain his hemoptysis, but I think it would be reasonable to do a bronchoscopy to examine his airways given his smoking history.  Before we do this we will send two more sputum cultures to rule out active, infectious TB.  I think that the most likely explanation for his hemoptysis is airway inflammation/bronchitis from ongoing coughing.    He does not have COPD or asthma based on his lung function testing.  Plan: -repeat sputum AFB x2 -bronchoscopy week of 12/15 if AFB smears negative -cough suppressive therapy with tramadol, voice rest -if bronch negative, then may need rhinoscopy/ENT evaluation   Cough Cough suppressive therapy with tramadol, nighttime tussionex (instructed not to use together or to use these and drive) and voice rest  Pulmonary nodules These are not causing his hemoptysis.  Given his smoking history, he needs a repeat CT chest in 6 months  Smoker Encouraged at length to quit today.   Updated Medication List Outpatient Encounter Prescriptions as of 12/07/2013  Medication Sig  . acetaminophen (TYLENOL) 500 MG tablet Take 1,000 mg by mouth every 6 (six) hours as needed for pain.   Marland Kitchen albuterol (PROVENTIL HFA;VENTOLIN HFA) 108 (90 BASE) MCG/ACT inhaler Inhale 2 puffs into the lungs every 6 (six) hours as needed for wheezing.  Marland Kitchen ALPRAZolam (XANAX) 1 MG tablet Take 1 mg by mouth at bedtime as needed for sleep.  . chlorpheniramine-HYDROcodone (TUSSIONEX PENNKINETIC ER) 10-8 MG/5ML LQCR Take 5 mLs by mouth every 12 (twelve) hours as needed for cough  (cough).  . feeding supplement, ENSURE COMPLETE, (ENSURE COMPLETE) LIQD Take 237 mLs by mouth 2 (two) times daily between meals.  . naproxen sodium (ANAPROX) 220 MG tablet Take 440 mg by mouth as needed (fever).  . ondansetron (ZOFRAN ODT) 4 MG disintegrating tablet Take 1 tablet (4 mg total) by mouth every 8 (eight) hours as needed for nausea or vomiting.  . traZODone (DESYREL) 100 MG tablet Take 200 mg by mouth at bedtime.  . traMADol (ULTRAM) 50 MG tablet Take 1 tablet (50 mg total) by mouth every 6 (six) hours as needed (as needed for cough).  . [DISCONTINUED] doxycycline (DORYX) 100 MG EC tablet Take 100 mg by mouth 2 (two) times daily.  . [DISCONTINUED] ondansetron (ZOFRAN) 4 MG tablet Take 1 tablet (4 mg total) by mouth every 8 (eight) hours for nausea.

## 2013-12-07 NOTE — Patient Instructions (Addendum)
We will contact your primary care physician to get results of the TB testing that has been done in the last few weeks  If all that was negative, then we will proceed with a bronchoscopy next week to try to find the source of bleeding.  For the pulmonary nodule: you need another CT chest in 6 months, this was ordered today  Your lung function tests do not show COPD.  There is no reason to change your inhalers today  For the cough:  Use tramadol 50mg  by mouth every 6 hours as needed for cough Pick three days between now and next week to suppress your cough, not talk, laugh or sing during this time.  Use hard candies and warm beverages to help sooth your throat.  Give [place] another sputum specimen.  If all three test negative for TB we will proceed with a bronchoscopy next week to try to find the source of the bleeding

## 2013-12-07 NOTE — Assessment & Plan Note (Signed)
These are not causing his hemoptysis.  Given his smoking history, he needs a repeat CT chest in 6 months

## 2013-12-08 ENCOUNTER — Other Ambulatory Visit: Payer: BC Managed Care – PPO

## 2013-12-08 ENCOUNTER — Other Ambulatory Visit: Payer: Self-pay | Admitting: Pulmonary Disease

## 2013-12-08 DIAGNOSIS — R05 Cough: Secondary | ICD-10-CM

## 2013-12-08 DIAGNOSIS — R042 Hemoptysis: Secondary | ICD-10-CM

## 2013-12-09 ENCOUNTER — Other Ambulatory Visit: Payer: BC Managed Care – PPO

## 2013-12-09 ENCOUNTER — Telehealth: Payer: Self-pay | Admitting: Pulmonary Disease

## 2013-12-09 DIAGNOSIS — R05 Cough: Secondary | ICD-10-CM

## 2013-12-09 NOTE — Telephone Encounter (Signed)
Pt now in lobby. Pt dropped his cough syrup. It was in a glass bottle and went everywhere. He needs a new rx for this

## 2013-12-09 NOTE — Telephone Encounter (Signed)
Spoke with pt.  Reports Dr. Conley Rolls gave him rx for tussionex.  He got this filled at [facility].  Reports it was given in a glass bottle and he dropped it today.  Med went everywhere.  Pt requesting another tussionex rx.    Called [facility].  Was advised tussionex was given on 09/27/13 by Dr. Clelia Croft, on 10/23/13 by Dr. Gillian Scarce, on 11/08/13 by Dr. Loyal Jacobson, on 11/17/13 and 11/29/13 by Dr. Dallas Schimke, and on 12/05/13 by Dr. Conley Rolls.  Was advised each of these rxs were for 5-10 days of medication.  They did verify the tussionex given on 12/7 by Dr. Conley Rolls was given in a glass bottle.  I called Dr. Kendrick Fries to see if he would authorize tussionex rx for pt.  Advised of pt's report.  I also informed him of the information I was given from [facility].  Per BQ, he will not authorize pt to have this tussionex rx.    Called, spoke with pt.  Explained above to him.  He verbalized understanding of this.  He did mention that all of these providers are in the same office.  I asked him to contact Dr. Irwin Brakeman office for rx as this was the last pre scriber for the medication and d/t pt's report of that office giving previous tussionex rxs.  Pt states he has already asked them and was advised he would need to get this from the specialist.  I apologized and advised I could not give ok right now.  I offered to schedule OV to regroup.  Pt declined at this time stating he didn't like the way the medication made him feel anyway and he was just doing what BQ had asked.  Will route msg to BQ.

## 2013-12-10 LAB — RESPIRATORY CULTURE OR RESPIRATORY AND SPUTUM CULTURE: Organism ID, Bacteria: NORMAL

## 2013-12-10 NOTE — Telephone Encounter (Signed)
Message copied by Velvet Bathe on Fri Dec 10, 2013 12:03 PM ------      Message from: Max Fickle B      Created: Thu Dec 09, 2013 11:53 PM       A,            Please let him know that 2 of the 3 sputum smears are negative for AFB.  Please make sure he has a third one processing.  Once the third is negative we can go ahead and schedule his bronchoscopy.            Thanks      B ------

## 2013-12-10 NOTE — Telephone Encounter (Signed)
Pt has had multi phone calls and OVs since this message.

## 2013-12-10 NOTE — Telephone Encounter (Signed)
Patient brought 3rd sample in to the lab yesterday.  I advised him that it takes 2-3 days for [place] to get the results once the specimen has been sent off, and we will be in contact with him when we get those results.  I also stated that if this 3rd one comes out negative like the first two have then we will go ahead and schedule the bronch.  He is aware.  Just letting you know.   Morrie Sheldon

## 2013-12-13 ENCOUNTER — Telehealth: Payer: Self-pay

## 2013-12-13 NOTE — Telephone Encounter (Signed)
Message copied by Velvet Bathe on Mon Dec 13, 2013  9:45 AM ------      Message from: Lupita Leash      Created: Sun Dec 12, 2013  7:08 AM       A,            Please let him know that his third smear for TB was negative.  We can schedule a bronchoscopy any morning this week at [facility].       No fluoro. Yes TB precautions.            Thanks      B ------

## 2013-12-13 NOTE — Telephone Encounter (Signed)
Scheduled bronch at [facility] For 7:30 on Wednesday.  Pt is aware, and has been advised to not eat or drink after midnight the night before, and to arrive at 6:15.  Nothing further needed at this point.

## 2013-12-14 ENCOUNTER — Ambulatory Visit: Payer: BC Managed Care – PPO | Admitting: Gastroenterology

## 2013-12-14 ENCOUNTER — Encounter (HOSPITAL_COMMUNITY): Payer: Self-pay

## 2013-12-15 ENCOUNTER — Encounter (HOSPITAL_COMMUNITY): Payer: Self-pay | Admitting: Respiratory Therapy

## 2013-12-15 ENCOUNTER — Telehealth: Payer: Self-pay

## 2013-12-15 ENCOUNTER — Ambulatory Visit (HOSPITAL_COMMUNITY)
Admission: RE | Admit: 2013-12-15 | Discharge: 2013-12-15 | Disposition: A | Discharge status: Home / Self Care | Payer: BC Managed Care – PPO | Source: Ambulatory Visit | Attending: Pulmonary Disease | Admitting: Pulmonary Disease

## 2013-12-15 ENCOUNTER — Encounter (HOSPITAL_COMMUNITY)
Admission: RE | Disposition: A | Discharge status: Home / Self Care | Payer: Self-pay | Source: Ambulatory Visit | Attending: Pulmonary Disease

## 2013-12-15 DIAGNOSIS — R042 Hemoptysis: Secondary | ICD-10-CM

## 2013-12-15 DIAGNOSIS — J0410 Acute tracheitis without obstruction: Secondary | ICD-10-CM | POA: Insufficient documentation

## 2013-12-15 DIAGNOSIS — R918 Other nonspecific abnormal finding of lung field: Secondary | ICD-10-CM

## 2013-12-15 DIAGNOSIS — R911 Solitary pulmonary nodule: Secondary | ICD-10-CM | POA: Insufficient documentation

## 2013-12-15 DIAGNOSIS — Z765 Malingerer [conscious simulation]: Secondary | ICD-10-CM | POA: Insufficient documentation

## 2013-12-15 DIAGNOSIS — F1910 Other psychoactive substance abuse, uncomplicated: Secondary | ICD-10-CM | POA: Insufficient documentation

## 2013-12-15 DIAGNOSIS — F17200 Nicotine dependence, unspecified, uncomplicated: Secondary | ICD-10-CM

## 2013-12-15 HISTORY — PX: VIDEO BRONCHOSCOPY: SHX5072

## 2013-12-15 SURGERY — VIDEO BRONCHOSCOPY WITHOUT FLUORO
Anesthesia: Moderate Sedation | Laterality: Bilateral

## 2013-12-15 MED ORDER — FENTANYL CITRATE 0.05 MG/ML IJ SOLN
INTRAMUSCULAR | Status: DC | PRN
  Administered 2013-12-15: 25 ug via INTRAVENOUS
  Administered 2013-12-15: 50 ug via INTRAVENOUS
  Administered 2013-12-15 (×2): 25 ug via INTRAVENOUS

## 2013-12-15 MED ORDER — LIDOCAINE HCL 1 % IJ SOLN
INTRAMUSCULAR | Status: DC | PRN
  Administered 2013-12-15: 6 mL via RESPIRATORY_TRACT

## 2013-12-15 MED ORDER — FENTANYL CITRATE 0.05 MG/ML IJ SOLN
INTRAMUSCULAR | Status: AC
  Filled 2013-12-15: qty 4

## 2013-12-15 MED ORDER — LIDOCAINE HCL 2 % EX GEL
CUTANEOUS | Status: DC | PRN
  Administered 2013-12-15: 1

## 2013-12-15 MED ORDER — PHENYLEPHRINE HCL 0.25 % NA SOLN
NASAL | Status: DC | PRN
  Administered 2013-12-15: 1 via NASAL

## 2013-12-15 MED ORDER — PHENYLEPHRINE HCL 0.25 % NA SOLN
1.0000 | Freq: Four times a day (QID) | NASAL | Status: DC | PRN
  Filled 2013-12-15: qty 15

## 2013-12-15 MED ORDER — LIDOCAINE HCL 2 % EX GEL
Freq: Once | CUTANEOUS | Status: DC
  Filled 2013-12-15: qty 5

## 2013-12-15 MED ORDER — BUTAMBEN-TETRACAINE-BENZOCAINE 2-2-14 % EX AERO: 1.0000 | INHALATION_SPRAY | Freq: Once | CUTANEOUS | Status: DC

## 2013-12-15 MED ORDER — MIDAZOLAM HCL 10 MG/2ML IJ SOLN
INTRAMUSCULAR | Status: DC | PRN
  Administered 2013-12-15 (×3): 1 mg via INTRAVENOUS
  Administered 2013-12-15: 2 mg via INTRAVENOUS

## 2013-12-15 MED ORDER — MIDAZOLAM HCL 10 MG/2ML IJ SOLN
INTRAMUSCULAR | Status: AC
  Filled 2013-12-15: qty 4

## 2013-12-15 MED ORDER — SODIUM CHLORIDE 0.9 % IV SOLN
INTRAVENOUS | Status: DC
  Administered 2013-12-15: 08:00:00 via INTRAVENOUS

## 2013-12-15 NOTE — Telephone Encounter (Signed)
Referral sent in for patient to [facility], patient aware.  I had a lengthy conversation, states he was coughing up blood and was very unhappy with his experience this morning, is in a lot of pain and is coughing up blood.  He denied any consolation or offers to try and work him in, stating that he would go to the E.D. If he worsens and would rather just wait on his ENT referral.  I urged him to call us if there was anything we could do.  Just letting you know.  A

## 2013-12-15 NOTE — Progress Notes (Signed)
Tolerated well Left AMA  Yolonda Kida PCCM Pager: [phone]

## 2013-12-15 NOTE — Telephone Encounter (Signed)
Message copied by Velvet Bathe on Wed Dec 15, 2013  9:57 AM ------      Message from: Max Fickle B      Created: Wed Dec 15, 2013  8:45 AM       A,            Please set up a consult for [facility], reason hemoptysis, recurrent nose bleeds; normal bronchoscopy on 12/17.            Thanks      B ------

## 2013-12-15 NOTE — Progress Notes (Signed)
Video Bronchoscopy done Intervention bronchial washing Procedure tolerated well 

## 2013-12-15 NOTE — Brief Op Note (Signed)
PCCM Bronchoscopy Procedure Note  The patient was informed of the risks (including but not limited to bleeding, infection, respiratory failure, lung injury, tooth/oral injury) and benefits of the procedure and gave consent, see chart.  Indication: Hemoptysis, Pulmonary nodule  Location: [facility] Bronchoscopy suite  Condition pre procedure: Chronic medical disease  Medications for procedure: 5mg  versed, Fentanyl IV, lidocaine 1% 12 cc  Procedure description: The bronchoscope was introduced through the nose and passed to the bilateral lungs to the level of the subsegmental bronchi throughout the tracheobronchial tree.  Airway exam revealed diffuse erythema consistent with acute inflammation throughout the trachea, but the remainder of the tracheobronchial tree was normal in appearance.  There were scant, thin, clear secretions which were non-obstructing.  There was no airway lesion, active bleeding, or evidence of previous bleeding.  At the completion of the procedure the focal cords and the epiglottis were inspected slowly and they were found to move normally without evidence of a lesion.    Procedures performed: BAL RUL anterior segment, 80cc injected, 20cc clear fluid returned  Specimens sent: BAL for cytology, culture (bacterial, fungal, afb)  Condition post procedure: stable vs  Patient instructions: use tramadol for cough, we will call with results  Complications: EBL > minor oozing related to the RUL anterior segment  Final diagnosis: 1) small pulmonary nodule RUL 2) Mild acute tacheal inflammation, otherwise no lesion 3) Drug seeking behavior  Note:  At the completion of the procedure the patient started demanding more tussionex and noted that he would get tussionex "from the bar" if I didn't give it to him.  He threatened to leave and find another doctor that would give it too him.  Yolonda Kida PCCM Pager: [phone] Cell: [phone] If no  response, call [phone]

## 2013-12-15 NOTE — H&P (Signed)
  I have seen and examined [name] who is here for a bronchoscopy today for hemoptysis.  NO changes to my note from last week.

## 2013-12-15 NOTE — Op Note (Signed)
PCCM Bronchoscopy Procedure Note  The patient was informed of the risks (including but not limited to bleeding, infection, respiratory failure, lung injury, tooth/oral injury) and benefits of the procedure and gave consent, see chart.  Indication: Hemoptysis, Pulmonary nodule  Location: [facility] Bronch[facility] suite  Condition pre procedure: Chronic medical disease  Medications for procedure: 5mg versed, Fentanyl 125mcg IV, lidocaine 1% 12 cc  Procedure description: The bronchoscope was introduced through the nose and passed to the bilateral lungs to the level of the subsegmental bronchi throughout the tracheobronchial tree.  Airway exam revealed diffuse erythema consistent with acute inflammation throughout the trachea, but the remainder of the tracheobronchial tree was normal in appearance.  There were scant, thin, clear secretions which were non-obstructing.  There was no airway lesion, active bleeding, or evidence of previous bleeding.  At the completion of the procedure the focal cords and the epiglottis were inspected slowly and they were found to move normally without evidence of a lesion.    Procedures performed: BAL RUL anterior segment, 80cc injected, 20cc clear fluid returned  Specimens sent: BAL for cytology, culture (bacterial, fungal, afb)  Condition post procedure: stable vs  Patient instructions: use tramadol for cough, we will call with results  Complications: EBL > minor oozing related to the RUL anterior segment  Final diagnosis: 1) small pulmonary nodule RUL 2) Mild acute tacheal inflammation, otherwise no lesion 3) Drug seeking behavior  Note:  At the completion of the procedure the patient started demanding more tussionex and noted that he would get tussionex "from the bar" if I didn't give it to him.  He threatened to leave and find another doctor that would give it too him.  Adi Doro [place] PCCM Pager: [phone] Cell: [phone] If no  response, call [phone]   

## 2013-12-16 ENCOUNTER — Encounter (HOSPITAL_COMMUNITY): Payer: Self-pay | Admitting: Pulmonary Disease

## 2013-12-17 ENCOUNTER — Telehealth: Payer: Self-pay

## 2013-12-17 LAB — CULTURE, RESPIRATORY W GRAM STAIN: Special Requests: NORMAL

## 2013-12-17 NOTE — Telephone Encounter (Signed)
Called pt, he is aware of results.  He also stated in depth that he is coughing up blood worse than before, is in pain, complained about his medications.  I offered to get the pt in to the office to see [place], he declined stating that he is going to [facility] on Tuesday and will go to the e.d. If he gets any worse.  Just letting you know.

## 2013-12-17 NOTE — Telephone Encounter (Signed)
Message copied by Velvet Bathe on Fri Dec 17, 2013  2:00 PM ------      Message from: Max Fickle B      Created: Fri Dec 17, 2013  1:48 PM       A,            Please let him know that his studies from the bronch were all normal.  This is good.            Thanks,      B ------

## 2013-12-19 ENCOUNTER — Encounter (HOSPITAL_COMMUNITY): Payer: Self-pay | Admitting: Emergency Medicine

## 2013-12-19 ENCOUNTER — Emergency Department (HOSPITAL_COMMUNITY)
Admission: EM | Admit: 2013-12-19 | Discharge: 2013-12-19 | Disposition: A | Discharge status: Home / Self Care | Payer: BC Managed Care – PPO | Attending: Emergency Medicine | Admitting: Emergency Medicine

## 2013-12-19 ENCOUNTER — Ambulatory Visit (INDEPENDENT_AMBULATORY_CARE_PROVIDER_SITE_OTHER): Payer: BC Managed Care – PPO | Admitting: Family Medicine

## 2013-12-19 ENCOUNTER — Emergency Department (HOSPITAL_COMMUNITY): Payer: BC Managed Care – PPO

## 2013-12-19 ENCOUNTER — Observation Stay (EMERGENCY_DEPARTMENT_HOSPITAL)
Admission: AD | Admit: 2013-12-19 | Discharge: 2013-12-21 | Disposition: A | Discharge status: Home / Self Care | Payer: BC Managed Care – PPO | Source: Ambulatory Visit | Attending: Family Medicine | Admitting: Family Medicine

## 2013-12-19 VITALS — BP 100/70 | HR 90 | Temp 97.7°F | Resp 16 | Wt 144.4 lb

## 2013-12-19 DIAGNOSIS — R05 Cough: Secondary | ICD-10-CM | POA: Insufficient documentation

## 2013-12-19 DIAGNOSIS — F17200 Nicotine dependence, unspecified, uncomplicated: Secondary | ICD-10-CM | POA: Insufficient documentation

## 2013-12-19 DIAGNOSIS — R55 Syncope and collapse: Secondary | ICD-10-CM

## 2013-12-19 DIAGNOSIS — R042 Hemoptysis: Secondary | ICD-10-CM

## 2013-12-19 DIAGNOSIS — R059 Cough, unspecified: Secondary | ICD-10-CM | POA: Insufficient documentation

## 2013-12-19 DIAGNOSIS — E860 Dehydration: Secondary | ICD-10-CM

## 2013-12-19 DIAGNOSIS — F410 Panic disorder [episodic paroxysmal anxiety] without agoraphobia: Secondary | ICD-10-CM | POA: Insufficient documentation

## 2013-12-19 DIAGNOSIS — Z880 Allergy status to penicillin: Secondary | ICD-10-CM | POA: Insufficient documentation

## 2013-12-19 DIAGNOSIS — R079 Chest pain, unspecified: Secondary | ICD-10-CM | POA: Diagnosis present

## 2013-12-19 DIAGNOSIS — R634 Abnormal weight loss: Secondary | ICD-10-CM

## 2013-12-19 DIAGNOSIS — Z79899 Other long term (current) drug therapy: Secondary | ICD-10-CM | POA: Insufficient documentation

## 2013-12-19 DIAGNOSIS — G8929 Other chronic pain: Secondary | ICD-10-CM | POA: Insufficient documentation

## 2013-12-19 DIAGNOSIS — R112 Nausea with vomiting, unspecified: Secondary | ICD-10-CM

## 2013-12-19 DIAGNOSIS — R1319 Other dysphagia: Secondary | ICD-10-CM

## 2013-12-19 DIAGNOSIS — I10 Essential (primary) hypertension: Secondary | ICD-10-CM | POA: Insufficient documentation

## 2013-12-19 DIAGNOSIS — E43 Unspecified severe protein-calorie malnutrition: Secondary | ICD-10-CM | POA: Insufficient documentation

## 2013-12-19 DIAGNOSIS — Z765 Malingerer [conscious simulation]: Secondary | ICD-10-CM

## 2013-12-19 DIAGNOSIS — F411 Generalized anxiety disorder: Secondary | ICD-10-CM

## 2013-12-19 DIAGNOSIS — Z8669 Personal history of other diseases of the nervous system and sense organs: Secondary | ICD-10-CM | POA: Insufficient documentation

## 2013-12-19 DIAGNOSIS — R1310 Dysphagia, unspecified: Secondary | ICD-10-CM | POA: Diagnosis present

## 2013-12-19 LAB — CBC
MCHC: 35.4 g/dL (ref 30.0–36.0)
MCV: 87.2 fL (ref 78.0–100.0)
Platelets: 195 10*3/uL (ref 150–400)
RBC: 5.22 MIL/uL (ref 4.22–5.81)
RDW: 12.6 % (ref 11.5–15.5)
WBC: 5.6 10*3/uL (ref 4.0–10.5)

## 2013-12-19 LAB — COMPREHENSIVE METABOLIC PANEL
ALT: 10 U/L (ref 0–53)
AST: 18 U/L (ref 0–37)
Albumin: 4.4 g/dL (ref 3.5–5.2)
CO2: 27 mEq/L (ref 19–32)
Chloride: 96 mEq/L (ref 96–112)
Creatinine, Ser: 1.08 mg/dL (ref 0.50–1.35)
GFR calc non Af Amer: 86 mL/min — ABNORMAL LOW (ref >90)
Sodium: 136 mEq/L (ref 135–145)
Total Bilirubin: 0.5 mg/dL (ref 0.3–1.2)
Total Protein: 7.4 g/dL (ref 6.0–8.3)

## 2013-12-19 LAB — TROPONIN I: Troponin I: <0.3 ng/mL (ref <0.30)

## 2013-12-19 LAB — PROTIME-INR
INR: 0.93 (ref 0.00–1.49)
Prothrombin Time: 12.3 seconds (ref 11.6–15.2)

## 2013-12-19 LAB — OCCULT BLOOD, POC DEVICE: Fecal Occult Bld: NEGATIVE

## 2013-12-19 MED ORDER — SODIUM CHLORIDE 0.9 % IV BOLUS (SEPSIS)
500.0000 mL | Freq: Once | INTRAVENOUS | Status: AC
  Administered 2013-12-19: 500 mL via INTRAVENOUS

## 2013-12-19 MED ORDER — SODIUM CHLORIDE 0.9 % IV BOLUS (SEPSIS)
1000.0000 mL | Freq: Once | INTRAVENOUS | Status: AC
  Administered 2013-12-19: 1000 mL via INTRAVENOUS

## 2013-12-19 MED ORDER — OXYCODONE HCL 5 MG PO TABS
10.0000 mg | ORAL_TABLET | Freq: Once | ORAL | Status: AC
  Administered 2013-12-19: 10 mg via ORAL
  Filled 2013-12-19: qty 2

## 2013-12-19 MED ORDER — ONDANSETRON HCL 4 MG/2ML IJ SOLN: 4.0000 mg | Freq: Four times a day (QID) | INTRAMUSCULAR | Status: DC | PRN

## 2013-12-19 MED ORDER — ALBUTEROL SULFATE HFA 108 (90 BASE) MCG/ACT IN AERS
2.0000 | INHALATION_SPRAY | Freq: Four times a day (QID) | RESPIRATORY_TRACT | Status: DC | PRN
Start: 2013-12-19 — End: 2013-12-21
  Filled 2013-12-19: qty 6.7

## 2013-12-19 MED ORDER — TRAZODONE HCL 100 MG PO TABS
200.0000 mg | ORAL_TABLET | Freq: Every day | ORAL | Status: DC
  Administered 2013-12-19 – 2013-12-20 (×2): 200 mg via ORAL
  Filled 2013-12-19 (×3): qty 2

## 2013-12-19 MED ORDER — ONDANSETRON HCL 4 MG/2ML IJ SOLN
4.0000 mg | Freq: Once | INTRAMUSCULAR | Status: DC
  Filled 2013-12-19: qty 2

## 2013-12-19 MED ORDER — DEXTROSE-NACL 5-0.45 % IV SOLN
INTRAVENOUS | Status: DC
  Administered 2013-12-19 – 2013-12-20 (×2): via INTRAVENOUS

## 2013-12-19 MED ORDER — SODIUM CHLORIDE 0.9 % IJ SOLN
3.0000 mL | Freq: Two times a day (BID) | INTRAMUSCULAR | Status: DC
  Administered 2013-12-19 – 2013-12-20 (×2): 3 mL via INTRAVENOUS

## 2013-12-19 MED ORDER — OXYCODONE HCL 10 MG PO TABS: 10.0000 mg | ORAL_TABLET | Freq: Once | ORAL | Status: DC

## 2013-12-19 MED ORDER — ALPRAZOLAM 0.25 MG PO TABS
1.0000 mg | ORAL_TABLET | Freq: Three times a day (TID) | ORAL | Status: DC
  Administered 2013-12-19 – 2013-12-21 (×5): 1 mg via ORAL
  Filled 2013-12-19 (×5): qty 4

## 2013-12-19 MED ORDER — ONDANSETRON HCL 4 MG PO TABS: 4.0000 mg | ORAL_TABLET | Freq: Four times a day (QID) | ORAL | Status: DC | PRN

## 2013-12-19 NOTE — ED Provider Notes (Signed)
CSN: [id number]     Arrival date & time 12/19/13  1315 History   First MD Initiated Contact with Patient 12/19/13 1331     Chief Complaint  Patient presents with  . Emesis  . Hemoptysis   (Consider location/radiation/quality/duration/timing/severity/associated sxs/prior Treatment) Patient is a 37 y.o. male presenting with vomiting. The history is provided by the patient.  Emesis Associated symptoms: no abdominal pain, no chills, no diarrhea, no headaches and no sore throat   pt with hx htn, chronic cough, c/o feeling generally weak and faint for the past few days. States is eating and drinking relatively little. States vomiting x 1 after eating mashed potatoes yesterday, emesis was not bloody or bilious. Having normal bms, no melena or rectal bleeding. Denies abd pain or distension. Had normal bm today. States also w chronic, intermittent cough that is ongoing.  Today after hard coughing spell, had coughed up small amt blood. Denies other abn bleeding or bruising, no anticoag use.  Denies sob. No fever or chills. Notes hx pna a few months ago.  Also notes recent bronchoscopy for chronic cough and lung nodule - was told tests negative. No night sweats. unspec wt dec in past year.         Past Medical History  Diagnosis Date  . Hypertension   . Seizures   . Panic attack   . Chronic back pain    Past Surgical History  Procedure Laterality Date  . Video bronchoscopy Bilateral 12/15/2013    Procedure: VIDEO BRONCHOSCOPY WITHOUT FLUORO;  Surgeon: Lupita Leash, MD;  Location: [facility];  Service: Cardiopulmonary;  Laterality: Bilateral;   No family history on file. History  Substance Use Topics  . Smoking status: Current Every Day Smoker -- 0.50 packs/day    Types: Cigarettes    Start date: 12/30/1988  . Smokeless tobacco: Never Used     Comment: Pt has smoked on and off since he was 13.  Pt has this time been smoking since 08/2012  . Alcohol Use: No    Review of Systems   Constitutional: Negative for fever and chills.  HENT: Negative for sore throat.   Eyes: Negative for redness.  Respiratory: Positive for cough. Negative for shortness of breath.   Cardiovascular: Negative for chest pain and leg swelling.  Gastrointestinal: Positive for vomiting. Negative for abdominal pain, diarrhea and blood in stool.  Genitourinary: Negative for hematuria and flank pain.  Musculoskeletal: Negative for back pain and neck pain.  Skin: Negative for rash.  Neurological: Negative for headaches.  Hematological: Does not bruise/bleed easily.  Psychiatric/Behavioral: Negative for confusion.    Allergies  Morphine and related and Penicillins  Home Medications   Current Outpatient Rx  Name  Route  Sig  Dispense  Refill  . acetaminophen (TYLENOL) 500 MG tablet   Oral   Take 1,000 mg by mouth every 6 (six) hours as needed for pain.          Marland Kitchen albuterol (PROVENTIL HFA;VENTOLIN HFA) 108 (90 BASE) MCG/ACT inhaler   Inhalation   Inhale 2 puffs into the lungs every 6 (six) hours as needed for wheezing.   1 Inhaler   0   . ALPRAZolam (XANAX) 1 MG tablet   Oral   Take 1 mg by mouth at bedtime as needed for sleep.         . chlorpheniramine-HYDROcodone (TUSSIONEX PENNKINETIC ER) 10-8 MG/5ML LQCR   Oral   Take 5 mLs by mouth every 12 (twelve) hours as needed for  cough (cough).   120 mL   0   . feeding supplement, ENSURE COMPLETE, (ENSURE COMPLETE) LIQD   Oral   Take 237 mLs by mouth 2 (two) times daily between meals.   30 Bottle   6   . naproxen sodium (ANAPROX) 220 MG tablet   Oral   Take 440 mg by mouth as needed (fever).         . ondansetron (ZOFRAN ODT) 4 MG disintegrating tablet   Oral   Take 1 tablet (4 mg total) by mouth every 8 (eight) hours as needed for nausea or vomiting.   30 tablet   1   . traMADol (ULTRAM) 50 MG tablet   Oral   Take 1 tablet (50 mg total) by mouth every 6 (six) hours as needed (as needed for cough).   40 tablet   0    . traZODone (DESYREL) 100 MG tablet   Oral   Take 200 mg by mouth at bedtime.          BP 118/76  Pulse 96  Temp(Src) 98.6 F (37 C) (Oral)  Resp 14  SpO2 100% Physical Exam  Nursing note and vitals reviewed. Constitutional: He is oriented to person, place, and time. He appears well-developed and well-nourished. No distress.  HENT:  Head: Atraumatic.  Nose: Nose normal.  Mouth/Throat: Oropharynx is clear and moist.  Eyes: Conjunctivae are normal. Pupils are equal, round, and reactive to light. No scleral icterus.  Neck: Normal range of motion. Neck supple. No tracheal deviation present.  Cardiovascular: Normal rate, regular rhythm, normal heart sounds and intact distal pulses.   Pulmonary/Chest: Effort normal and breath sounds normal. No accessory muscle usage. No respiratory distress. He exhibits no tenderness.  Abdominal: Soft. Bowel sounds are normal. He exhibits no distension and no mass. There is no tenderness. There is no rebound and no guarding.  Genitourinary:  No cva tenderness. Light brown stool.   Musculoskeletal: Normal range of motion. He exhibits no edema and no tenderness.  CTLS spine, non tender, aligned, no step off.   Neurological: He is alert and oriented to person, place, and time.  Steady gait.   Skin: Skin is warm and dry. He is not diaphoretic.  Psychiatric: He has a normal mood and affect.    ED Course  Procedures (including critical care time)  Results for orders placed during the hospital encounter of 12/19/13  PROTIME-INR      Result Value Range   Prothrombin Time 12.3  11.6 - 15.2 seconds   INR 0.93  0.00 - 1.49  CBC      Result Value Range   WBC 5.6  4.0 - 10.5 K/uL   RBC 5.22  4.22 - 5.81 MIL/uL   Hemoglobin 16.1  13.0 - 17.0 g/dL   HCT 57.8  46.9 - 62.9 %   MCV 87.2  78.0 - 100.0 fL   MCH 30.8  26.0 - 34.0 pg   MCHC 35.4  30.0 - 36.0 g/dL   RDW 52.8  41.3 - 24.4 %   Platelets 195  150 - 400 K/uL  COMPREHENSIVE METABOLIC PANEL       Result Value Range   Sodium 136  135 - 145 mEq/L   Potassium 3.6  3.5 - 5.1 mEq/L   Chloride 96  96 - 112 mEq/L   CO2 27  19 - 32 mEq/L   Glucose, Bld 104 (*) 70 - 99 mg/dL   BUN 9  6 - 23 mg/dL  Creatinine, Ser 1.08  0.50 - 1.35 mg/dL   Calcium 45.4  8.4 - 09.8 mg/dL   Total Protein 7.4  6.0 - 8.3 g/dL   Albumin 4.4  3.5 - 5.2 g/dL   AST 18  0 - 37 U/L   ALT 10  0 - 53 U/L   Alkaline Phosphatase 78  39 - 117 U/L   Total Bilirubin 0.5  0.3 - 1.2 mg/dL   GFR calc non Af Amer 86 (*) >90 mL/min   GFR calc Af Amer >90  >90 mL/min  TROPONIN I      Result Value Range   Troponin I <0.30  <0.30 ng/mL   Dg Chest 2 View  12/19/2013   CLINICAL DATA:  Syncope.  EXAM: CHEST  2 VIEW  COMPARISON:  Chest x-ray 11/29/2013, 09/22/2013. Chest CT 11/17/2013.  FINDINGS: Mediastinum and hilar structures are normal. The lungs are clear of acute infiltrates. There is no pleural effusion or pneumothorax. Stable apical pleural thickening is noted consistent with scarring. Pulmonary nodule noted on recent CT not identified by this chest x-ray, reference made to CT report. No significant bony abnormalities identified.  IMPRESSION: Findings consist with apical pleural scarring. No acute cardiopulmonary disease. Reference made to chest CT report of 11/17/2013 concerning pulmonary nodule identified in the right pulmonary apex.   Electronically Signed   By: Maisie Fus  Register   On: 12/19/2013 14:23   Dg Chest 2 View  11/29/2013   CLINICAL DATA:  Cough.  EXAM: CHEST  2 VIEW  COMPARISON:  November 08, 2013.  FINDINGS: Cardiomediastinal silhouette appears normal. Mild hyperexpansion of the lungs is noted suggesting chronic obstructive pulmonary disease. No pleural effusion or pneumothorax is noted. Stable bilateral apical thickening is noted. No acute pulmonary disease is noted. Bony thorax is intact.  IMPRESSION: No acute cardiopulmonary abnormality seen.   Electronically Signed   By: Roque Lias M.D.   On: 11/29/2013  15:57   US Soft Tissue Head/neck  11/24/2013   CLINICAL DATA:  Small thyroid nodule noted on recent CT of the chest  EXAM: THYROID ULTRASOUND  TECHNIQUE: Ultrasound examination of the thyroid gland and adjacent soft tissues was performed.  COMPARISON:  CT of the chest of 11/17/2013  FINDINGS: Right thyroid lobe  Measurements: 4.4 x 1.8 x 2.1 cm. There are tiny hypoechoic structures but no measurable nodule is seen.  Left thyroid lobe  Measurements: 4.0 x 1.8 x 1.5 cm. Similar tiny hypoechoic structures are present but none are measurable.  Isthmus  Thickness: 2 mm thickness.  No nodules visualized.  Lymphadenopathy  None visualized.  IMPRESSION: The thyroid gland is normal in size. No measurable solid or cystic nodule is seen.   Electronically Signed   By: Dwyane Dee M.D.   On: 11/24/2013 16:17      EKG Interpretation    Date/Time:  Sunday December 19 2013 13:17:15 EST Ventricular Rate:  99 PR Interval:  153 QRS Duration: 89 QT Interval:  359 QTC Calculation: 461 R Axis:   92 Text Interpretation:  Sinus rhythm right axis deviation Borderline T abnormalities, anterior leads Baseline wander in lead(s) V4 No previous tracing Confirmed by Rosezetta Balderston  MD, Victormanuel Mclure ([id number]) on 12/19/2013 1:35:19 PM            MDM  Iv ns bolus. Labs.  Reviewed nursing notes and prior charts for additional history.   Nursing note referencing passing out 15 x in 3 days noted.  Pt denies any trauma, injury or fall to ground  during any of these episodes. No palpitations. No cp or discomfort. No sob or unusual doe.   Recheck pt no vomiting or coughing noted during time in ED.  No increased wob.   Labs c/w baseline.  Pt drove self to ED, has to drive home. Pt had requests narcotic pain med in ed, however given driving situation and benign exam, no narcotic rx given in ed.   Pt tolerating po fluids. Alert. Comfortable appearing. No dysrhythmia on monitor. No cp. No sob.   Pt appears stable for d/c.      Suzi Roots, MD 12/21/13 [phone]

## 2013-12-19 NOTE — ED Notes (Signed)
Notified Steinl that pt is requesting pain meds

## 2013-12-19 NOTE — ED Notes (Signed)
Pt removed IV himself and refused to be taken to d/c in wheelchair

## 2013-12-19 NOTE — H&P (Signed)
Family Medicine Teaching [facility] Admission History and Physical Service Pager: [phone]  Patient name: Alexis West Medical record number: [id number] Date of birth: 1976/10/30 Age: 37 y.o. Gender: male  Primary Care Provider: Lucilla Edin, MD Consultants: none Code Status: full  Chief Complaint: syncope  Assessment and Plan: Tran Arzuaga is a 37 y.o. male presenting with a variety of complaints, syncope, weight loss, and hemoptysis . PMH is significant for HTN, panic attacks, chronic back pain, and benzo withdrawal.  # Syncope: patient presents with several syncopal events over the past week. The patients description would be most consistent with an orthostatic hypotension picture. Though must consider other causes such as ACS, arrhythmia, vasovagal, event, cardiac structural abnormality. -will admit to tele for observation, attending Dr Mauricio Po -will obtain repeat am EKG -cycle troponins, order echo and carotid dopplers -will check UDS and TSH for non-cardiac causes -check orthostatics -consider cards consult if syncope recurs or testing is positive  # Hemoptysis: patient followed by Dr Kendrick Fries and had recent bronch that did not reveal a source of this issue. Has been referred outpatient to ENT for evaluation of a potential cause. Hgb is normal and on admission vital signs were stable with BP on the low end of normal. -will monitor for further episodes -follow hgb on am cbc -will need to keep follow-up with ENT for further evaluation  # weight loss: patient with difficulty taking in solid foods and feeling as though these are stuck. Patient notably with negative HIV recently. Potentially there is an esophageal/GI issue leading to inability to swallow appropriately. Patient has been referred to outpatient GI, though per report he has not followed through with this referral. -will supplement in hospital with resource -consider GI consult in the am for further evaluation of  dysphagia  # anxiety: will continue home xanax 1 mg PO TID and trazodone for sleep  # Chronic back pain: will continue patient reported oxycodone 10 mg BID prn -check UDS -would not discharge patient with pain medication given seeking behavior  FEN/GI: full liquid diet, D5 1/2 NS 125 mL/hr Prophylaxis: SCDs, no SQ heparin given potential bleed  Disposition: admit to tele, discharge pending completion of syncope work-up  History of Present Illness: Alexis West is a 37 y.o. male with a history of HTN, panic attacks, chronic back pain, and benzo withdrawal seizures presenting with syncopal event. Patient evaluated in the ED earlier today for feeling generally weak and faint. He noted at that time he has been eating and drinking relatively little recently. He also noted that he has been coughing frequently since he had PNA in September and that he coughed up a small amount of blood prior to presenting to the ED. He was worked up with a CBC, CMET, PT/INR, and troponin which were all negative. He also had a CXR at that time given a nursing note referencing patient passing out 15 times in 3 days, this CXR revealed apical pleural scarring with no acute disease. He also had an EKG that revealed sinus rhythm and no apparent ischemic changes. He was given NS bolus and felt stable for discharge.  After discharge he presented to [facility] urgent care for further evaluation. Per Dr Michaelle Copas report the patient had a witnessed syncopal event in their waiting room. Stated he was coughing and then had a syncopal event. He did not exhibit any seizure activity and had no post-ictal phase. He did not have any chest pain with this event. Patient describes recent syncopal episodes as feeling  dizzy then with decreased hearing and diminished vision. Then he states "its like mrrra" and he passes out. He notes this happened 3 times today. Also notes this happening 12-15x 4-5 days ago. Notes it almost always occurs when he has  been in bed laying down or sitting down and then stands up.   Note the patient has been undergoing eval by pulm for hemoptysis and recently underwent a bronchoscopy that revealed mild acute tracheal inflammation and was otherwise normal. Following this procedure "the patient started demanding more tussionex and noted that he would get tussionex "from the bar" if I didn't give it to him. He threatened to leave and find another doctor that would give it too him."  The patient also reports not having eaten anything in the past several weeks. Noting that food gets stuck and has pain with swallowing. He notes that he feels as though his throat is swollen. 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 He felt this may be causing his issues swallowing. He notes trying to eat mashed potatoes yesterday and vomiting them up. States has been able to drink fluids well and drinks 1 L Mt Dew daily. Patient notes no diarrhea, melena, or bloody stools. He reports weight loss of 30+ pounds and on review of records appears to have lost 19 lbs since 09/27/13. Note patient has been referred to GI outpatient with poor follow-up on his part regarding this issue.   Review Of Systems: Per HPI with the following additions: none Otherwise 12 point review of systems was performed and was unremarkable.  Patient Active Problem List   Diagnosis Date Noted  . Syncope 12/19/2013  . Drug-seeking behavior 12/15/2013  . Hemoptysis, unspecified 12/07/2013  . Cough 12/07/2013  . Pulmonary nodules 12/07/2013  . Smoker 10/22/2013  . Anxiety state, unspecified 10/22/2013   Past Medical History: Past Medical History  Diagnosis Date  . Hypertension   . Panic attack   . Chronic back pain   . Seizures     Benzo withdrawal seizure 2004.   Past Surgical History: Past Surgical History  Procedure Laterality Date  . Video bronchoscopy Bilateral  12/15/2013    Procedure: VIDEO BRONCHOSCOPY WITHOUT FLUORO;  Surgeon: Lupita Leash, MD;  Location: [facility];  Service: Cardiopulmonary;  Laterality: Bilateral;   Social History: History  Substance Use Topics  . Smoking status: Current Every Day Smoker -- 0.50 packs/day    Types: Cigarettes    Start date: 12/30/1988  . Smokeless tobacco: Never Used     Comment: Pt has smoked on and off since he was 13.  Pt has this time been smoking since 08/2012  . Alcohol Use: No   Additional social history: none  Please also refer to relevant sections of EMR.  Family History: Family History  Problem Relation Age of Onset  . Hypertension Father    Allergies and Medications: Allergies  Allergen Reactions  . Morphine And Related     blisters  . Penicillins     Nausea, vomiting, nose bleeds   No current facility-administered medications on file prior to encounter.   Current Outpatient Prescriptions on File Prior to Encounter  Medication Sig Dispense Refill  . albuterol (PROVENTIL HFA;VENTOLIN HFA) 108 (90 BASE) MCG/ACT inhaler Inhale 2 puffs into the lungs every 6 (six) hours as needed for wheezing.  1 Inhaler  0  . ALPRAZolam (XANAX) 1 MG  tablet Take 1 mg by mouth 3 (three) times daily.       . chlorpheniramine-HYDROcodone (TUSSIONEX PENNKINETIC ER) 10-8 MG/5ML LQCR Take 5 mLs by mouth every 12 (twelve) hours as needed for cough (cough).  120 mL  0  . naproxen sodium (ANAPROX) 220 MG tablet Take 440 mg by mouth as needed (fever).      . ondansetron (ZOFRAN ODT) 4 MG disintegrating tablet Take 1 tablet (4 mg total) by mouth every 8 (eight) hours as needed for nausea or vomiting.  30 tablet  1  . traZODone (DESYREL) 100 MG tablet Take 200 mg by mouth at bedtime.        Objective: BP 102/61  Pulse 72  Temp(Src) 97.6 F (36.4 C) (Oral)  Resp 20  Ht 5' 10.5" (1.791 m)  SpO2 100% Exam: General: NAD, laying comfortably in bed HEENT: NCAT, mildly dry MM, PERRL, no cervical  adenopathy noted, no carotid bruits noted Cardiovascular: rrr, no murmurs appreciated Respiratory: CTAB, no wheezes or crackles Abdomen: s, mild tenderness in epigastrium, ND, no guarding or rebound Extremities: no edema Skin: scattered tattoos, no noticeable lesions  Neuro: alert, CN 2-12 intact, 5/5 strength in bilateral biceps, triceps, grip, hip flexors, quads, plantar and dorsiflexion, sensation to light touch intact throughout bilateral upper and lower extremities, 2+ patellar reflexes bilaterally  Labs and Imaging: CBC BMET   Recent Labs Lab 12/19/13 1335  WBC 5.6  HGB 16.1  HCT 45.5  PLT 195    Recent Labs Lab 12/19/13 1335  NA 136  K 3.6  CL 96  CO2 27  BUN 9  CREATININE 1.08  GLUCOSE 104*  CALCIUM 10.0     Results for orders placed in visit on 12/19/13 (from the past 24 hour(s))  GLUCOSE, POCT (MANUAL RESULT ENTRY)     Status: None   Collection Time    12/19/13  4:54 PM      Result Value Range   POC Glucose 91  70 - 99 mg/dl   Trop <1.61 FOBT neg INR 0.93  Dg Chest 2 View  12/19/2013   IMPRESSION: Findings consist with apical pleural scarring. No acute cardiopulmonary disease. Reference made to chest CT report of 11/17/2013 concerning pulmonary nodule identified in the right pulmonary apex.    Glori Luis, MD 12/19/2013, 8:59 PM PGY-2, [facility] FPT[facility] Intern pager: [phone], text pages welcome

## 2013-12-19 NOTE — Progress Notes (Signed)
Subjective:    Patient ID: Alexis West, male    DOB: Jun 17, 1976, 37 y.o.   MRN: [id number] This chart was scribed for Alexis Chick, MD by Danella Maiers, ED Scribe. This patient was seen in room 7 and the patient's care was started at 4:39 PM.  Chief Complaint  Patient presents with  . Follow-up     HPI HPI Comments: Alexis West is a 37 y.o. male with a h/o HTN, seizures, and drug-seeking behavior who presents to the [facility] from the ED with dysphagia, vomiting, unintentional weight loss, and hemoptysis.  Reports that he has not eaten anything in the past two weeks; tried to eat mashed potatoes yesterday and vomited up potatoes. Food is getting stuck.  +Pain with swallowing.  Feels that throat is swollen; denies sore throat.  Denies tongue swelling.  +Able to drink fluids; usually drinks 1 liter of Mt. Dew daily.  No abdominal pain.  +Decreased stools yet feels due to decreased po intake.  He has lost 27 pounds since 07/2013.  +Dysphagia, vomiting post-prandially stated in 08/2013 with diagnosis of pneumonia.  No diarrhea; no melena; no bloody stools.  No sore throat.    Undergoing evaluation by pulmonology for hemoptysis, weight loss, pneumonia. Diagnosed with pneumonia 08/2013; treated with Levaquin, Amoxicillin, Doxycycline; non-compliant with multiple abx.   S/p bronchoscopy five days ago by [facility]; respiratory culture negative; cytology negative; AFB negative.    Fungal culture on broncopscopy was negative. Cytology was negative. Culture was negative.   Pt was seen in the ED earlier today for , nausea, emesis, inability to keep down food. CXR in ED was stable without acute changes.  S/p 3 liters of iv fluids in ED.  CBC, CMET, Troponin I all negative; EKG NSR.  Hemoccult negative in ED.  Was to be discharged so presented to [facility] [place] for further evaluation.  While checking in at [facility], suffered witnessed syncopal event; no seizure activity observed.    Tthyroid  ultrasound on 11/18/13 was normal. CT of chest normal other than  6mm nodule in the right upper lobe. CT head in October 2014 was normal.   Weight on 07/31/13 was 180. Weight on 12/9 was 153.  Has not weighed yet today.  He denies epistaxis today yet Dr. Kendrick Fries has referred him to ENT; appointment with [facility] 48 hours.  . Pt states he was diagnosed with pneumonia 3 months ago and has had a cough since. He was not admitted for pneumonia. He states blood was squirting out of his nose and mouth after the procedure.   Pt also reports nausea and vomiting intermittently since being diagnosed with pneumonia. He states he has not eaten in 2 weeks because his throat is swollen and he is unable to keep food down. He reports mild sore throat. He states he tried to eat mashed potatoes yesterday yesterday but he threw it up.  He reports he has lost 30 pounds in the last 4 months. He is able to keep fluids down. He states he drinks a liter of Mountain Dew per day. He states he urinated once today and once yesterday. He states he has infrequent BMs. He had a rectal exam from ED physician that was negative. He has not seen a GI doctor; he has not undergone GI evaluation while undergoing pulmonology evaluation. He denies abdominal pain.  His last seizure was a few days ago. He is not on any seizure medication. He has never seen a neurologist. He  reports 10 years ago he was on 8 mg alprazolam per day, his doctor wanted him to come off it so he stopped suddenly and had 2 seizures that week. He states last month he started trying to taper down off alprazolam 1mg  and thinks that might have something to do with the seizures.  He reports chronic neck and back pain which is being followed by Dr Cleta Alberts. He denies h/o surgeries. His mom passed when she was 62 and states "we just found her brain dead". He is in the process of a divorce. He does not have children. 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  He lives at a friend's house. He smokes 10 cigarettes per day. He does not drink. His last alcoholic drink was a long time ago. He does not use drugs.    PCP - Lucilla Edin, MD  Past Medical History  Diagnosis Date  . Hypertension   . Panic attack   . Chronic back pain   . Seizures     Benzo withdrawal seizure 2004.   Current Outpatient Prescriptions on File Prior to Visit  Medication Sig Dispense Refill  . albuterol (PROVENTIL HFA;VENTOLIN HFA) 108 (90 BASE) MCG/ACT inhaler Inhale 2 puffs into the lungs every 6 (six) hours as needed for wheezing.  1 Inhaler  0  . ALPRAZolam (XANAX) 1 MG tablet Take 1 mg by mouth 3 (three) times daily.       . chlorpheniramine-HYDROcodone (TUSSIONEX PENNKINETIC ER) 10-8 MG/5ML LQCR Take 5 mLs by mouth every 12 (twelve) hours as needed for cough (cough).  120 mL  0  . naproxen sodium (ANAPROX) 220 MG tablet Take 440 mg by mouth as needed (fever).      . ondansetron (ZOFRAN ODT) 4 MG disintegrating tablet Take 1 tablet (4 mg total) by mouth every 8 (eight) hours as needed for nausea or vomiting.  30 tablet  1  . traZODone (DESYREL) 100 MG tablet Take 200 mg by mouth at bedtime.       Current Facility-Administered Medications on File Prior to Visit  Medication Dose Route Frequency Provider Last Rate Last Dose  . ondansetron (ZOFRAN-ODT) disintegrating tablet 16 mg  16 mg Oral Once Pearline Cables, MD       Allergies  Allergen Reactions  . Morphine And Related     blisters  . Penicillins     Nausea, vomiting, nose bleeds     Review of Systems  Constitutional: Positive for unexpected weight change. Negative for fever, chills, diaphoresis and fatigue.  HENT: Positive for nosebleeds, sore throat, trouble swallowing and voice change.   Respiratory: Positive for cough and shortness of breath. Negative for wheezing.   Cardiovascular: Negative for chest pain.  Gastrointestinal: Positive for nausea, vomiting and constipation. Negative for abdominal  pain, diarrhea, blood in stool, abdominal distention, anal bleeding and rectal pain.  Genitourinary: Positive for decreased urine volume.  Musculoskeletal: Positive for back pain (baseline) and neck pain (baseline).  Skin: Negative for rash.  Neurological: Positive for dizziness, seizures, syncope and light-headedness. Negative for numbness.       Objective:   Physical Exam  Nursing note and vitals reviewed. Constitutional: He is oriented to person, place, and time. He appears well-developed and well-nourished. No distress.  HENT:  Head: Normocephalic and atraumatic.  Right Ear: External ear normal.  Left Ear: External ear normal.  Mouth/Throat: No oropharyngeal exudate.  Blood visualized on tongue. No active bleeding.   Eyes: Conjunctivae and EOM are  normal. Pupils are equal, round, and reactive to light.  Neck: Neck supple. No tracheal deviation present.  Cardiovascular: Normal rate, regular rhythm, normal heart sounds and intact distal pulses.   No murmur heard. Pulmonary/Chest: Effort normal and breath sounds normal. No respiratory distress. He has no wheezes. He has no rales.  Abdominal: Soft. Bowel sounds are normal. He exhibits no distension and no mass. There is tenderness in the epigastric area. There is no rebound and no guarding.  Musculoskeletal: Normal range of motion.  Lymphadenopathy:    He has cervical adenopathy.       Right cervical: Superficial cervical adenopathy present.  Neurological: He is alert and oriented to person, place, and time. No cranial nerve deficit. He exhibits normal muscle tone. Coordination normal.  Skin: Skin is warm and dry. No rash noted. He is not diaphoretic.  Psychiatric: He has a normal mood and affect. His behavior is normal.    Filed Vitals:   12/19/13 1633 12/19/13 1726 12/19/13 1727 12/19/13 1728  BP: 112/78 118/72 100/70 100/70  Pulse: 78 66 80 90  Temp: 97.7 F (36.5 C)     TempSrc: Oral     Resp: 16     Weight:    144 lb 6.4  oz (65.499 kg)  SpO2: 100% 100% 100% 100%   Results for orders placed in visit on 12/19/13  GLUCOSE, POCT (MANUAL RESULT ENTRY)      Result Value Range   POC Glucose 91  70 - 99 mg/dl   EKG:  NSR; no acute changes.      Assessment & Plan:   1. Syncope and collapse   2. Dehydration   3. Hemoptysis, unspecified   4. Smoker   5. Cough   6. Other dysphagia   7. Weight loss, unintentional     1.  Syncopal event:  New. Occurred in waiting room of office.  EKG NSR without acute changes; glucose normal; vitals normal; orthostatics normal.  Weight down nine pounds from 12/07/13.  IV placed and will admit for observation and further evaluation. 2.  Dehydration: New.  S/p reported 3 liters of ivf in ED with syncopal event in office; iv placed; will transport to [facility] for admission.   3.  Dysphagia:  New. Onset 08/2013; non-compliant with GI consultation; more than 30 pound weight loss in past four months; warrants EGD and GI consultation. 4.  Hemoptysis:  New.  S/p CT chest and bronchoscopy with negative work up thus far. 5. Unintentional weight loss:  New.  Weight loss 30+ pounds in past four months.  Warrants EGD and colonoscopy.  Warrants CT abd/pelvis.  HIV negative in past month. Warrants UDS to evaluate for drug abuse.  Denies drug use at this time.   6. Tobacco abuse: continues to smoke.   7. Non-compliance:  Has recently been non-compliant with GI consultation and pulmonology consultation.  Frequent office visits at [facility] and ED over the past four months.  Warrants admission to facilitate care. 8. Drug seeking behavior:  Requesting frequent tussionex for chronic cough and requesting narcotics for chest pain associated with cough. Dr. Kendrick Fries prescribed/recommended Tramadol for chronic cough; pt demanded Tussionex after bronchoscopy and threatening to find another pulmonologist who would provide rx.  Warrants drug screen during admission.  Denies drug use at visit.  No orders of the defined  types were placed in this encounter.    I personally performed the services described in this documentation, which was scribed in my presence.  The recorded information  has been reviewed and is accurate.  Nilda Simmer, M.D.  [facility] [address] [place], [place]  [address] [phone] phone [phone] fax

## 2013-12-19 NOTE — ED Notes (Signed)
Patient is from home. Pt passed out in parking lot. Patient had lung biopsy on last week here at [facility]. Patient states he has passed out 15 times in the last three days. Patient was recently treated for Pneumonia. Patient also c/o body pain and inability to swallow. Patient is AXOX3 vitals stable.

## 2013-12-20 ENCOUNTER — Encounter (HOSPITAL_COMMUNITY): Payer: Self-pay | Admitting: *Deleted

## 2013-12-20 ENCOUNTER — Telehealth: Payer: Self-pay

## 2013-12-20 DIAGNOSIS — R079 Chest pain, unspecified: Secondary | ICD-10-CM | POA: Diagnosis present

## 2013-12-20 DIAGNOSIS — F411 Generalized anxiety disorder: Secondary | ICD-10-CM

## 2013-12-20 DIAGNOSIS — E43 Unspecified severe protein-calorie malnutrition: Secondary | ICD-10-CM | POA: Insufficient documentation

## 2013-12-20 DIAGNOSIS — R1310 Dysphagia, unspecified: Secondary | ICD-10-CM | POA: Diagnosis present

## 2013-12-20 DIAGNOSIS — F1910 Other psychoactive substance abuse, uncomplicated: Secondary | ICD-10-CM

## 2013-12-20 DIAGNOSIS — R634 Abnormal weight loss: Secondary | ICD-10-CM | POA: Diagnosis present

## 2013-12-20 DIAGNOSIS — R042 Hemoptysis: Secondary | ICD-10-CM

## 2013-12-20 DIAGNOSIS — R55 Syncope and collapse: Secondary | ICD-10-CM

## 2013-12-20 LAB — BASIC METABOLIC PANEL
BUN: 7 mg/dL (ref 6–23)
Calcium: 8.5 mg/dL (ref 8.4–10.5)
Creatinine, Ser: 0.87 mg/dL (ref 0.50–1.35)
GFR calc Af Amer: >90 mL/min (ref >90)
GFR calc non Af Amer: >90 mL/min (ref >90)
Potassium: 3.5 mEq/L (ref 3.5–5.1)

## 2013-12-20 LAB — CBC
Hemoglobin: 12.5 g/dL — ABNORMAL LOW (ref 13.0–17.0)
MCH: 30.6 pg (ref 26.0–34.0)
MCHC: 34.4 g/dL (ref 30.0–36.0)
Platelets: 142 10*3/uL — ABNORMAL LOW (ref 150–400)
RDW: 12.7 % (ref 11.5–15.5)
WBC: 5.9 10*3/uL (ref 4.0–10.5)

## 2013-12-20 LAB — RAPID URINE DRUG SCREEN, HOSP PERFORMED
Amphetamines: NOT DETECTED
Barbiturates: NOT DETECTED
Tetrahydrocannabinol: NOT DETECTED

## 2013-12-20 LAB — TROPONIN I
Troponin I: <0.3 ng/mL (ref <0.30)
Troponin I: <0.3 ng/mL (ref <0.30)

## 2013-12-20 MED ORDER — OXYCODONE HCL 5 MG PO TABS
10.0000 mg | ORAL_TABLET | Freq: Once | ORAL | Status: AC
  Administered 2013-12-20: 10 mg via ORAL
  Filled 2013-12-20: qty 2

## 2013-12-20 MED ORDER — ENSURE COMPLETE PO LIQD
237.0000 mL | Freq: Two times a day (BID) | ORAL | Status: DC
  Administered 2013-12-20: 237 mL via ORAL

## 2013-12-20 MED ORDER — NICOTINE 21 MG/24HR TD PT24
21.0000 mg | MEDICATED_PATCH | Freq: Every day | TRANSDERMAL | Status: DC
  Administered 2013-12-20 – 2013-12-21 (×2): 21 mg via TRANSDERMAL
  Filled 2013-12-20 (×2): qty 1

## 2013-12-20 MED ORDER — SODIUM CHLORIDE 0.9 % IV SOLN
INTRAVENOUS | Status: DC
  Administered 2013-12-20: 23:00:00 via INTRAVENOUS
  Administered 2013-12-21: 500 mL via INTRAVENOUS

## 2013-12-20 MED ORDER — BOOST / RESOURCE BREEZE PO LIQD
1.0000 | Freq: Three times a day (TID) | ORAL | Status: DC
Start: 2013-12-20 — End: 2013-12-20
  Administered 2013-12-20: 1 via ORAL

## 2013-12-20 NOTE — Progress Notes (Signed)
FMTS Attending Note  The plan of care was discussed with the resident team. I agree with the assessment and plan as documented by the resident.   Agree with cardiology consult given EKG changes (T wave inversions in V1-V4), appreciate cardiology input.  Appreciate GI input for further recommendations on weight loss/difficulty swallowing.  Outpatient workup for hemoptysis negative, monitor hemoglobin.  Syncope - complete workup with carotids US and Echo. Cocaine/opioid use likely contributing to EKG changes and syncope  Donnella Sham MD

## 2013-12-20 NOTE — Progress Notes (Signed)
INITIAL NUTRITION ASSESSMENT  DOCUMENTATION CODES Per approved criteria  -Severe malnutrition in the context of acute illness or injury   INTERVENTION: Ensure Complete po BID, each supplement provides 350 kcal and 13 grams of protein RD to follow for nutrition care plan  NUTRITION DIAGNOSIS: Inadequate oral intake related to poor appetite as evidenced by patient report  Goal: Pt to meet >/= 90% of their estimated nutrition needs   Monitor:  PO & supplemental intake, weight, labs, I/O's  Reason for Assessment: Malnutrition Screening Tool Report  37 y.o. male  Admitting Dx: Chest pain  ASSESSMENT: Patient with PMH of HTN, panic attacks, chronic back pain, and benzo withdrawal; presented with a variety of complaints including syncope, weight loss, and hemoptysis.  Patient reports a poor appetite; he states he wasn't really eating anything 2 weeks PTA; + vomiting; also reports weight loss; per weight readings, patient has had a 10% weight loss since October 2014 (severe for time frame); has [employer] ordered, however, would like Strawberry Ensure instead -- RD to order.  Patient meets criteria for severe malnutrition in the context of acute illness as evidenced by < 50% of estimated energy requirement for > 5 days and 10% weight loss x 2 months.  Height: Ht Readings from Last 1 Encounters:  12/19/13 5' 10.5" (1.791 m)    Weight: Wt Readings from Last 1 Encounters:  12/19/13 144 lb 6.4 oz (65.499 kg)    Ideal Body Weight: 166 lb  % Ideal Body Weight: 86%  Wt Readings from Last 10 Encounters:  12/19/13 144 lb 6.4 oz (65.499 kg)  12/07/13 153 lb (69.4 kg)  12/05/13 146 lb (66.225 kg)  11/29/13 151 lb (68.493 kg)  11/21/13 154 lb 12.8 oz (70.217 kg)  11/17/13 156 lb (70.761 kg)  11/08/13 157 lb 12.8 oz (71.578 kg)  10/22/13 160 lb 9.6 oz (72.848 kg)  10/15/13 158 lb (71.668 kg)  10/07/13 156 lb 12.8 oz (71.124 kg)    Usual Body Weight: 160 lb  % Usual Body  Weight: 90%  BMI:  20.3 kg/m2  Estimated Nutritional Needs: Kcal: 2100-2300 Protein: 100-110 gm Fluid: 2.1-2.3 L  Skin: Intact  Diet Order: Full Liquid  EDUCATION NEEDS: -No education needs identified at this time   Intake/Output Summary (Last 24 hours) at 12/20/13 1236 Last data filed at 12/20/13 1610  Gross per 24 hour  Intake      0 ml  Output      1 ml  Net     -1 ml    Labs:   Recent Labs Lab 12/19/13 1335 12/20/13 0230  NA 136 139  K 3.6 3.5  CL 96 106  CO2 27 27  BUN 9 7  CREATININE 1.08 0.87  CALCIUM 10.0 8.5  GLUCOSE 104* 125*    Scheduled Meds: . ALPRAZolam  1 mg Oral TID  . feeding supplement (RESOURCE BREEZE)  1 Container Oral TID BM  . sodium chloride  3 mL Intravenous Q12H  . traZODone  200 mg Oral QHS    Continuous Infusions: . dextrose 5 % and 0.45% NaCl 125 mL/hr at 12/20/13 9604    Past Medical History  Diagnosis Date  . Hypertension   . Panic attack   . Chronic back pain   . Seizures     Benzo withdrawal seizure 2004.    Past Surgical History  Procedure Laterality Date  . Video bronchoscopy Bilateral 12/15/2013    Procedure: VIDEO BRONCHOSCOPY WITHOUT FLUORO;  Surgeon: Lupita Leash, MD;  Location: [facility];  Service: Cardiopulmonary;  Laterality: Bilateral;    Maureen Chatters, RD, LDN Pager #: [phone] After-Hours Pager #: [phone]

## 2013-12-20 NOTE — Progress Notes (Signed)
Pt continues threatening to leave the hospital AMA.  He stated that we need to give him Oxycodone four times a day and he needs a prescription to go home with.  He said he is trying to get pain meds and xanax from his pharmacy on [address] and if he is successful he will leave here AMA.    Pt also requested Tussinex, but I noted that the prescribing doctor of this med had stopped the medication. MD was notified of pt requests.

## 2013-12-20 NOTE — H&P (Signed)
FMTS Attending Admit Note Patient seen and examined by me, discussed with Dr Birdie Sons and I agree with his assess/plan.  Patient admitted directly from [facility] after a syncopal episode there; of note, rapid unexplained weight loss and reports of hemoptysis in a smoker.  Patient says his syncope always follows an episode of "coughing up blood".  Thus far Troponins negative; no events on tele when reviewed with tele RN.  This morning he denies chest pain or shortness of breath.  Hasn't had a restful night ("they keep waking me up").   Awake and in no distress.  Neck supple.  REgular S1S2 PULM Generalized coarseness bilat.  ABD soft, flat, nontender.   A/P: Admission for syncope workup.  Positive orthostatic BPs; responded to NS bolus overnight.  Agree with ECHO, repeat ECG and completion of Troponin cycling.  Confirm stability of Hgb in light of history.  Need to collect the UDS. Workup has included AFB, HIV (both negative per Dr Michaelle Copas note).  I suspect weight loss and subsequent intermittent hypotension are contributors to syncope. Paula Compton, MD

## 2013-12-20 NOTE — Consult Note (Signed)
Unassigned Patient   Reason for Consult: Chest pain and dysphagia Referring Physician: Teaching Service  Alexis West HPI: This is a 37 year old male with a number of medical complaints admitted for syncope.  He was noted to be hypotensive on admission, but the current work up is not clear.  There is also a history of hemoptysis and he was evaluated with a bronchoscopy recently with negative findings.  The patient reports that he lost 80 lbs since the onset of his pneumonia.  It was at that time he also started to have issues with dysphagia and odynophagia.  The patient is HIV negative.  No prior history of these complaints and he denies any GERD issues.  Past Medical History  Diagnosis Date  . Hypertension   . Panic attack   . Chronic back pain   . Seizures     Benzo withdrawal seizure 2004.    Past Surgical History  Procedure Laterality Date  . Video bronchoscopy Bilateral 12/15/2013    Procedure: VIDEO BRONCHOSCOPY WITHOUT FLUORO;  Surgeon: Lupita Leash, MD;  Location: [facility];  Service: Cardiopulmonary;  Laterality: Bilateral;    Family History  Problem Relation Age of Onset  . Hypertension Father     Social History:  reports that he has been smoking Cigarettes.  He started smoking about 24 years ago. He has been smoking about 0.50 packs per day. He has never used smokeless tobacco. He reports that he does not drink alcohol or use illicit drugs.  Allergies:  Allergies  Allergen Reactions  . Morphine And Related     blisters  . Penicillins     Nausea, vomiting, nose bleeds    Medications:  Scheduled: . ALPRAZolam  1 mg Oral TID  . feeding supplement (ENSURE COMPLETE)  237 mL Oral BID BM  . sodium chloride  3 mL Intravenous Q12H  . traZODone  200 mg Oral QHS   Continuous: . dextrose 5 % and 0.45% NaCl 125 mL/hr at 12/20/13 1610    Results for orders placed during the hospital encounter of 12/19/13 (from the past 24 hour(s))  TROPONIN I     Status:  None   Collection Time    12/19/13 10:00 PM      Result Value Range   Troponin I <0.30  <0.30 ng/mL  TSH     Status: None   Collection Time    12/19/13 10:00 PM      Result Value Range   TSH 0.566  0.350 - 4.500 uIU/mL  TROPONIN I     Status: None   Collection Time    12/20/13  2:30 AM      Result Value Range   Troponin I <0.30  <0.30 ng/mL  BASIC METABOLIC PANEL     Status: Abnormal   Collection Time    12/20/13  2:30 AM      Result Value Range   Sodium 139  135 - 145 mEq/L   Potassium 3.5  3.5 - 5.1 mEq/L   Chloride 106  96 - 112 mEq/L   CO2 27  19 - 32 mEq/L   Glucose, Bld 125 (*) 70 - 99 mg/dL   BUN 7  6 - 23 mg/dL   Creatinine, Ser 9.60  0.50 - 1.35 mg/dL   Calcium 8.5  8.4 - 45.4 mg/dL   GFR calc non Af Amer >90  >90 mL/min   GFR calc Af Amer >90  >90 mL/min  CBC     Status: Abnormal  Collection Time    12/20/13  2:30 AM      Result Value Range   WBC 5.9  4.0 - 10.5 K/uL   RBC 4.09 (*) 4.22 - 5.81 MIL/uL   Hemoglobin 12.5 (*) 13.0 - 17.0 g/dL   HCT 47.8 (*) 29.5 - 62.1 %   MCV 88.8  78.0 - 100.0 fL   MCH 30.6  26.0 - 34.0 pg   MCHC 34.4  30.0 - 36.0 g/dL   RDW 30.8  65.7 - 84.6 %   Platelets 142 (*) 150 - 400 K/uL  URINE RAPID DRUG SCREEN (HOSP PERFORMED)     Status: Abnormal   Collection Time    12/20/13  8:34 AM      Result Value Range   Opiates NONE DETECTED  NONE DETECTED   Cocaine POSITIVE (*) NONE DETECTED   Benzodiazepines POSITIVE (*) NONE DETECTED   Amphetamines NONE DETECTED  NONE DETECTED   Tetrahydrocannabinol NONE DETECTED  NONE DETECTED   Barbiturates NONE DETECTED  NONE DETECTED  TROPONIN I     Status: None   Collection Time    12/20/13 12:14 PM      Result Value Range   Troponin I <0.30  <0.30 ng/mL     Dg Chest 2 View  12/19/2013   CLINICAL DATA:  Syncope.  EXAM: CHEST  2 VIEW  COMPARISON:  Chest x-ray 11/29/2013, 09/22/2013. Chest CT 11/17/2013.  FINDINGS: Mediastinum and hilar structures are normal. The lungs are clear of acute  infiltrates. There is no pleural effusion or pneumothorax. Stable apical pleural thickening is noted consistent with scarring. Pulmonary nodule noted on recent CT not identified by this chest x-ray, reference made to CT report. No significant bony abnormalities identified.  IMPRESSION: Findings consist with apical pleural scarring. No acute cardiopulmonary disease. Reference made to chest CT report of 11/17/2013 concerning pulmonary nodule identified in the right pulmonary apex.   Electronically Signed   By: Maisie Fus  Register   On: 12/19/2013 14:23    ROS:  As stated above in the HPI otherwise negative.  Blood pressure 120/70, pulse 65, temperature 98.4 F (36.9 C), temperature source Oral, resp. rate 19, height 5\' 10"  (1.778 m), weight 143 lb 4.8 oz (65 kg), SpO2 98.00%.    PE: Gen: NAD, Alert and Oriented HEENT:  [place]/AT, EOMI Neck: Supple, no LAD Lungs: CTA Bilaterally CV: RRR without M/G/R ABM: Soft, NTND, +BS Ext: No C/C/E  Assessment/Plan: 1) Dysphagia. 2) Odynophagia. 3) Weight loss.   I do not know the etiology of his complaints, but he has lost a significant amount of weight.  However, he also has multiple medical issues occurring.  Further evaluation with an EGD is required.  Plan: 1) EGD tomorrow.  Alexis West D 12/20/2013, 6:44 PM

## 2013-12-20 NOTE — Progress Notes (Signed)
  Echocardiogram 2D Echocardiogram has been performed.  Alexis West 12/20/2013, 11:49 AM

## 2013-12-20 NOTE — Progress Notes (Signed)
VASCULAR LAB PRELIMINARY  PRELIMINARY  PRELIMINARY  PRELIMINARY  Carotid duplex completed.    Preliminary report:  Bilateral:  1-39% ICA stenosis.  Vertebral artery flow is antegrade.     Lindaann Gradilla, RVT 12/20/2013, 11:27 AM

## 2013-12-20 NOTE — Telephone Encounter (Signed)
Called him. Left message for him to call me back.  

## 2013-12-20 NOTE — Progress Notes (Signed)
Family Medicine Teaching Service Daily Progress Note Intern Pager: [phone]  Patient name: Alexis West Medical record number: [id number] Date of birth: 10-Dec-1976 Age: 37 y.o. Gender: male  Primary Care Provider: Lucilla Edin, MD Consultants: none Code Status: full  Pt Overview and Major Events to Date:  12/22 - Admitted with syncope  Assessment and Plan: Hilario Robarts is a 37 y.o. male presenting with a variety of complaints, syncope, weight loss, and hemoptysis . PMH is significant for HTN, panic attacks, chronic back pain, and benzo withdrawal.   # Syncope: patient presents with several syncopal events over the past week. The patients description would be most consistent with an orthostatic hypotension picture. Though must consider other causes such as ACS, arrhythmia, vasovagal, event, cardiac structural abnormality.   - repeat am EKG with T-wave inversions in V1-V4 - troponins neg x3 - echo and carotid dopplers pending - UDS positive for benzos and cocaine - TSH 1.164 - orthostatics positive - consult cards today    # Hemoptysis: patient followed by Dr Kendrick Fries and had recent bronch that did not reveal a source of this issue. Has been referred outpatient to ENT for evaluation of a potential cause. Hgb is normal and on admission vital signs were stable with BP on the low end of normal.  -will monitor for further episodes  -follow hgb on am cbc  -will need to keep follow-up with ENT for further evaluation   # weight loss: patient with difficulty taking in solid foods and feeling as though these are stuck. Patient notably with negative HIV recently. Potentially there is an esophageal/GI issue leading to inability to swallow appropriately. Patient has been referred to outpatient GI, though per report he has not followed through with this referral.  -will supplement in hospital with resource  -GI consult today for further evaluation of dysphagia   # anxiety: will continue home  xanax 1 mg PO TID and trazodone for sleep    # Chronic back pain: will continue patient reported oxycodone 10 mg BID prn  -UDS positive for benzos and cocaine, not opiates -would not discharge patient with pain medication given seeking behavior and long list of prescribers in database  FEN/GI: full liquid diet, D5 1/2 NS 125 mL/hr  Prophylaxis: SCDs, no SQ heparin given potential bleed   Disposition: homee pending completion of syncope and GI work-up  Subjective: Feeling ok this morning, still having some chest pain  Objective: Temp:  [97.6 F (36.4 C)-98.6 F (37 C)] 98.1 F (36.7 C) (12/22 0625) Pulse Rate:  [54-97] 59 (12/22 0625) Resp:  [12-20] 18 (12/22 0625) BP: (88-124)/(55-78) 96/55 mmHg (12/22 0625) SpO2:  [98 %-100 %] 99 % (12/22 0625) Weight:  [144 lb 6.4 oz (65.499 kg)] 144 lb 6.4 oz (65.499 kg) (12/21 1728) Physical Exam: General: NAD, laying comfortably in bed  HEENT: NCAT, mildly dry MM, PERRL, no cervical adenopathy noted, no carotid bruits noted  Cardiovascular: rrr, no murmurs appreciated  Respiratory: CTAB, no wheezes or crackles  Abdomen: s, mild tenderness in RUQ, ND, no guarding or rebound  Extremities: no edema  Skin: scattered tattoos, no noticeable lesions  Neuro: alert, no focal deficits   Laboratory:  Recent Labs Lab 12/19/13 1335 12/20/13 0230  WBC 5.6 5.9  HGB 16.1 12.5*  HCT 45.5 36.3*  PLT 195 142*    Recent Labs Lab 12/19/13 1335 12/20/13 0230  NA 136 139  K 3.6 3.5  CL 96 106  CO2 27 27  BUN 9 7  CREATININE 1.08 0.87  CALCIUM 10.0 8.5  PROT 7.4  --   BILITOT 0.5  --   ALKPHOS 78  --   ALT 10  --   AST 18  --   GLUCOSE 104* 125*   Trop neg x3  Imaging/Diagnostic Tests: CXR: Findings consist with apical pleural scarring. No acute cardiopulmonary disease. Reference made to chest CT report of 11/17/2013 concerning pulmonary nodule identified in the right pulmonary apex.  Beverely Low, MD 12/20/2013, 6:43 AM PGY-1, [facility]  Health Family Medicine FPTS Intern pager: [phone], text pages welcome

## 2013-12-20 NOTE — Telephone Encounter (Signed)
PT WOULD LIKE TO SPEAK WITH SOMEONE REGARDING HIS VISIT, WAS SEEN YESTERDAY BY DR Cleta Alberts PLEASE CALL [phone]

## 2013-12-20 NOTE — Progress Notes (Signed)
UR completed 

## 2013-12-20 NOTE — Progress Notes (Signed)
Interim Progress Note:  S: I was called to speak with patient before he leaves AMA. I spoke with [name] who is complaining of back pain, anxiety, and claustrophobia. He would like to be on his regular dose of oxycodone, which he states is 10 mg QID.  When told that his UDS was negative for opiates, he could not give explanation. He interrupts frequently and is agitated by my explanation of withholding opiates, because they could worsen his syncope. He states that he was not admitted for syncope but rather for spitting up blood. He keeps stating that he wants to be "comfortable" and is able to recognize the importance of staying the hospital for evaluation of the weight loss.   O: BP 120/70  Pulse 65  Temp(Src) 98.4 F (36.9 C) (Oral)  Resp 19  Ht 5\' 10"  (1.778 m)  Wt 143 lb 4.8 oz (65 kg)  BMI 20.56 kg/m2  SpO2 98%  Gen: non ill appearing, very anxious Psych: agry demeanor, rapid pressured speech, mild tremulousness  A/P: [name] is 36 year old M with weight loss of > 20 lb in 6 mos (although some notes say 80lb in last year), anxiety, hemoptysis, numerous syncopal episodes, and cocaine abuse with drug seek behavior concerning for drug diversion.   #Back Pain, Chronic - Pt to be given 10 mg oxycodone x 1 for pain b/c I do not want him to go into withdrawals  # Syncope - like secondary to substance abuse (benzodiazepines) and dehydration - no further episodes today and pt appears well hydrated so KVO IVF  # Tobacco Abuse - > 1 ppd smoker - Give nicotine patch 21 mg  # Cocaine Abuse, Drug Seeking and like drug diversion -  PT with 21 separate scripts for controlled substances in since August 2014 - Discussed issues of drug addiction with patient who did not acknowledge it and blamed on it pain from pneumonia for 4 months - Absolutely will not provide controlled substances on discharge.   # Dysphagia - EGD tomorrow  # Hemoptysis - under current work up from pulmonology with  normal CT scan and normal bronch from 12/17, so more likely blood nasal drainage from cocaine use vs GI souce  Si Raider. Clinton Sawyer, MD, [facility] 12/20/2013, 9:36 PM Family Medicine Resident, PGY-3   -

## 2013-12-21 ENCOUNTER — Encounter (HOSPITAL_COMMUNITY): Payer: Self-pay | Admitting: Gastroenterology

## 2013-12-21 ENCOUNTER — Telehealth: Payer: Self-pay

## 2013-12-21 ENCOUNTER — Encounter (HOSPITAL_COMMUNITY)
Admission: AD | Disposition: A | Discharge status: Home / Self Care | Payer: Self-pay | Source: Ambulatory Visit | Attending: Family Medicine

## 2013-12-21 DIAGNOSIS — R1310 Dysphagia, unspecified: Secondary | ICD-10-CM

## 2013-12-21 DIAGNOSIS — E43 Unspecified severe protein-calorie malnutrition: Secondary | ICD-10-CM

## 2013-12-21 HISTORY — PX: ESOPHAGOGASTRODUODENOSCOPY: SHX5428

## 2013-12-21 IMAGING — CT CT CHEST W/O CM
2 of 4 series · 15 of 36 positions shown, 18 images · non-contrast
Comparison: Chest radiograph November 08, 2013

CLINICAL DATA: Persistent cough ; abnormal chest radiograph

EXAM:
CT CHEST WITHOUT CONTRAST
TECHNIQUE: Multidetector CT imaging of the chest was performed following the
standard protocol without IV contrast.

[Series 3: chest w/o st · axial · non-contrast · 0.78mm/px · z∈[+838,+1112]mm · 12 of 66 slices shown, 15 images]
[im 6/66  mediastinal]
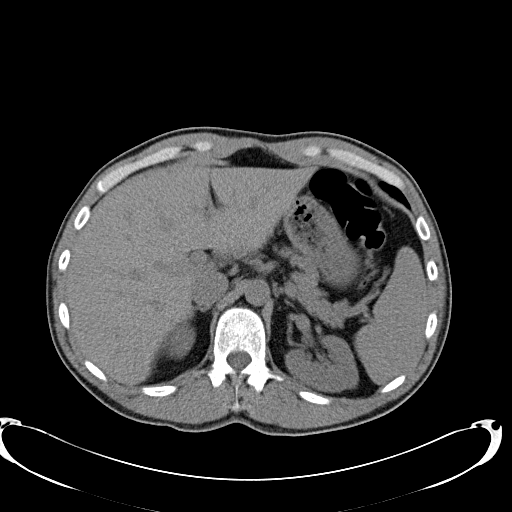
[im 6/66  lung]
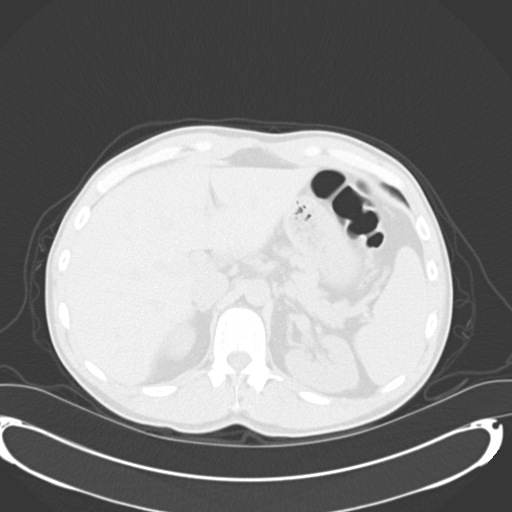
[im 11/66  lung]
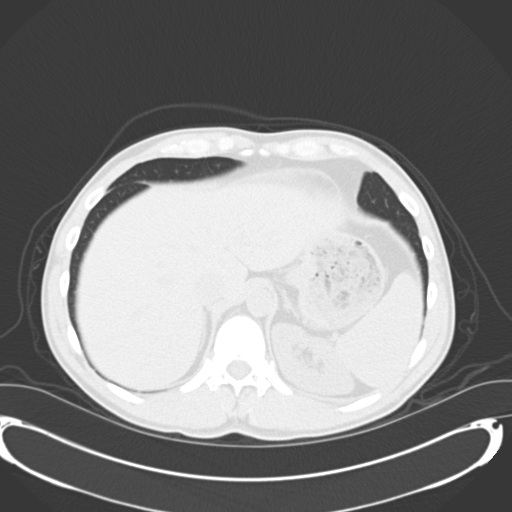
[im 16/66  lung]
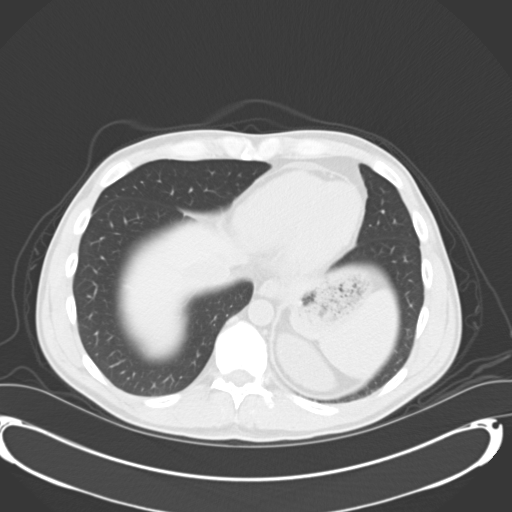
[im 21/66  lung]
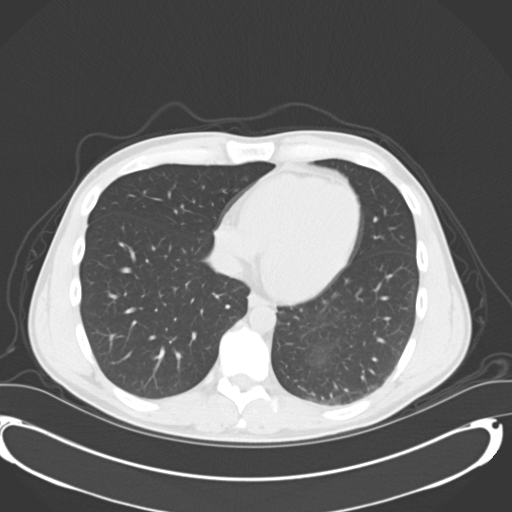
[im 26/66  mediastinal]
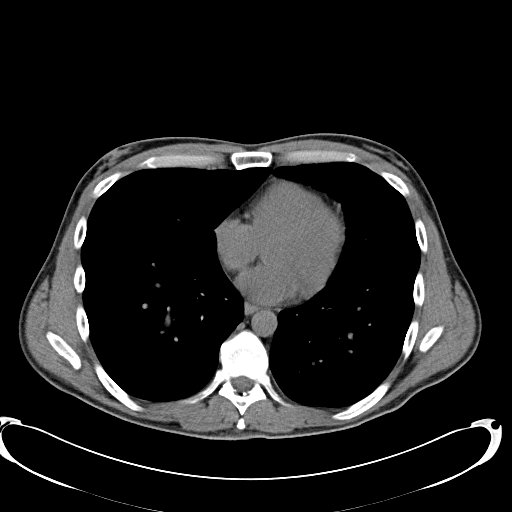
[im 26/66  lung]
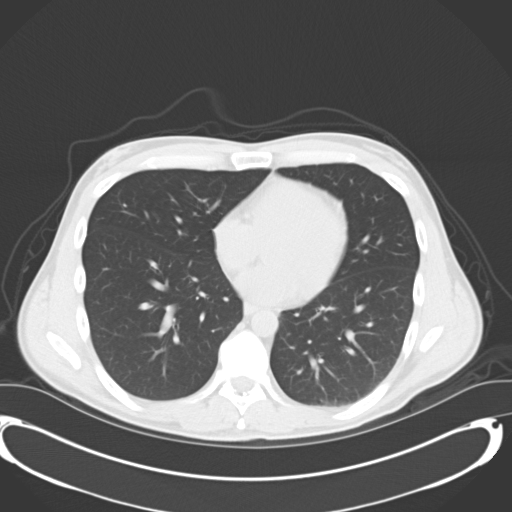
[im 31/66  lung]
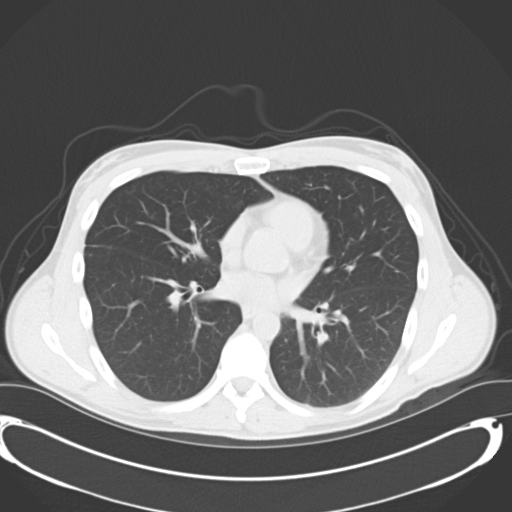
[im 36/66  lung]
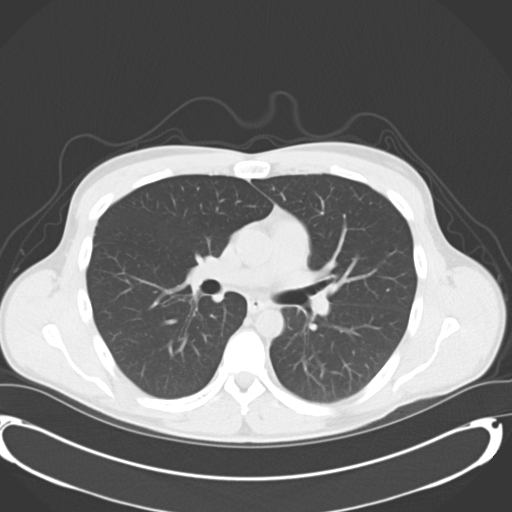
[im 41/66  lung]
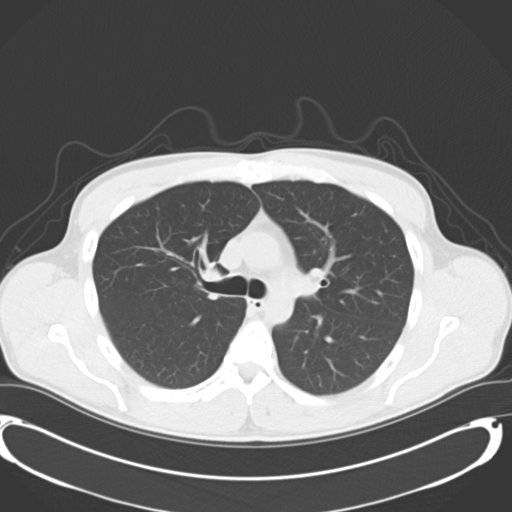
[im 46/66  mediastinal]
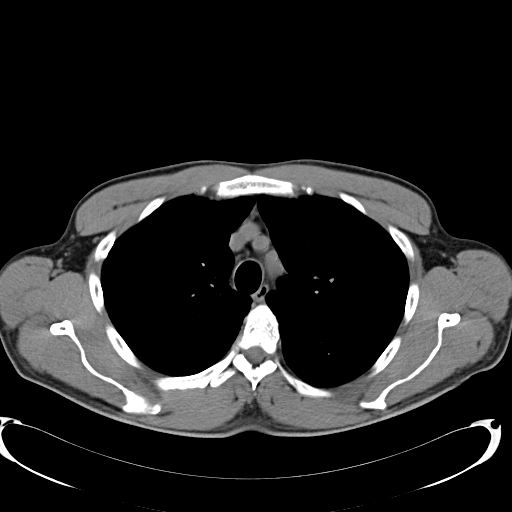
[im 46/66  lung]
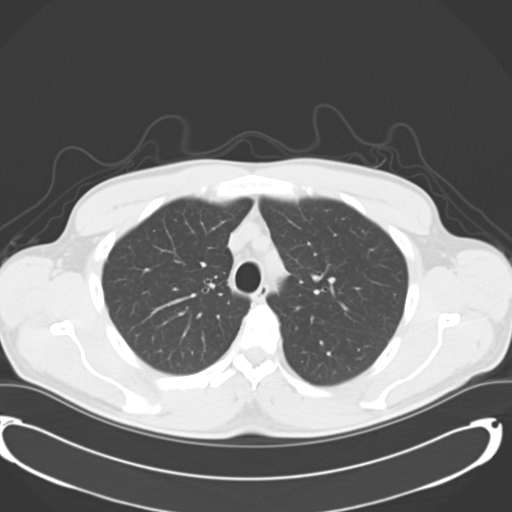
[im 51/66  lung]
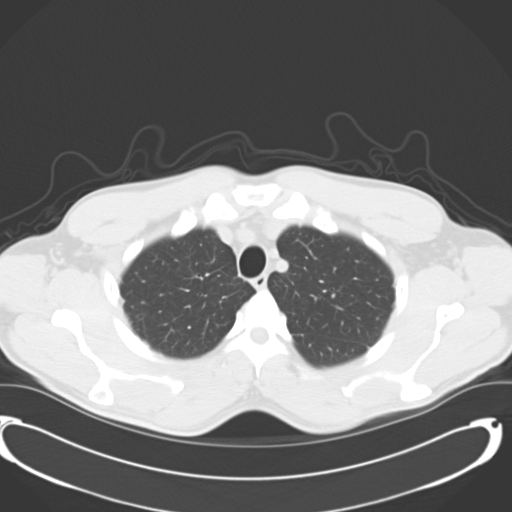
[im 56/66  lung]
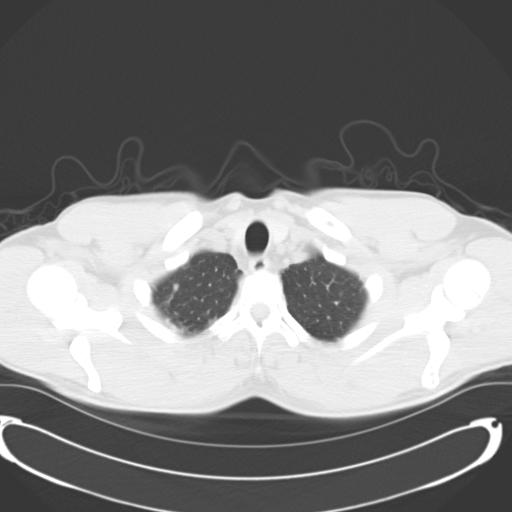
[im 61/66  lung]
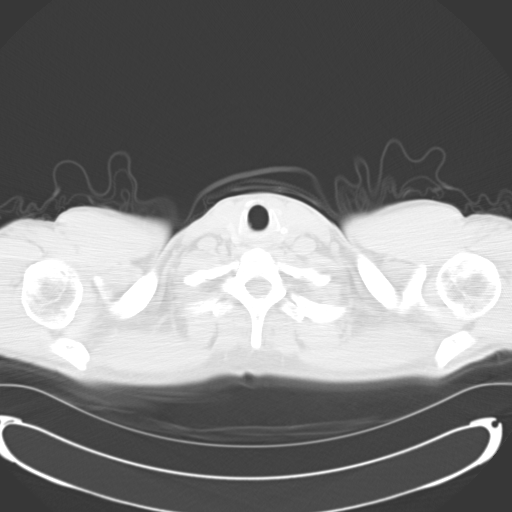

[Series 602: cor · coronal · 0.78mm/px · 3 of 112 slices shown]
[im 23/112  lung]
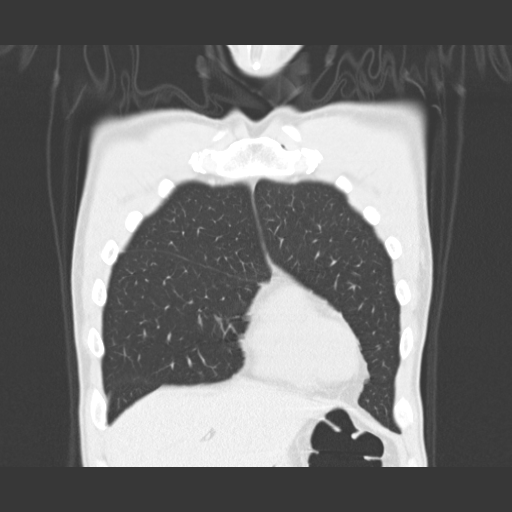
[im 45/112  lung]
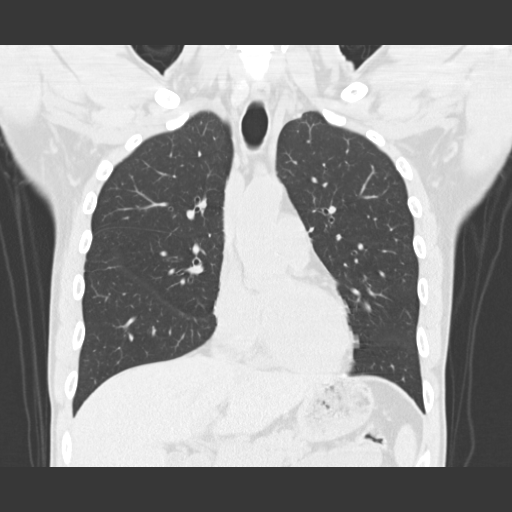
[im 67/112  lung]
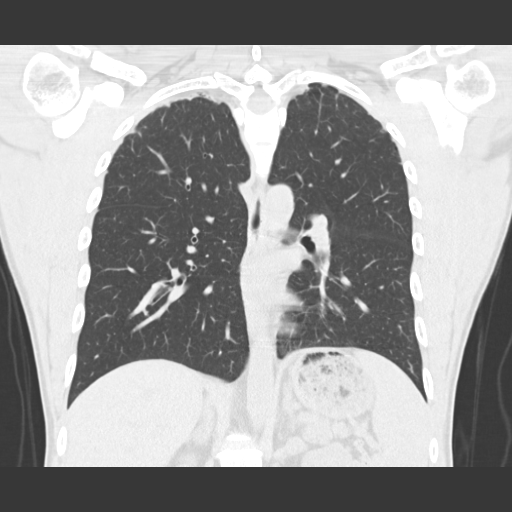

[15 of 36 positions shown; findings below may reference images not displayed]

FINDINGS: There is mild symmetric apical pleural thickening and scarring. On
axial slice 11 and coronal slice 59, there is a 6 mm nodular opacity
in the right apex. No other parenchymal nodular opacity is seen.
Also on axial slice 13, there is a 2 mm nodular opacity in the
anterior segment right upper lobe near the apex.

Elsewhere lungs are clear.  There is no edema or consolidation.

The there is no appreciable thoracic adenopathy. There is slight
pericardial thickening anteriorly.

Visualized upper abdominal structures appear normal. There are no
blastic or lytic bone lesions. Thyroid appears normal except for a
single subcentimeter nodule in the right lobe measuring 5 mm.
IMPRESSION: Mild apical scarring bilaterally. There is a 6 mm nodular opacity
with a nearby 2 mm nodular opacity in the right upper lobe/ apex
region. Followup of this nodular opacity should be based on
[HOSPITAL] guidelines.

No edema or consolidation.

No adenopathy.

Probable minimal pericardial fluid anteriorly.

Subcentimeter thyroid nodular opacity of questionable clinical
significance.

If the patient is at high risk for bronchogenic carcinoma, follow-up
chest CT at 6-12 months is recommended. If the patient is at low
risk for bronchogenic carcinoma, follow-up chest CT at 12 months is
recommended. This recommendation follows the consensus statement:
Guidelines for Management of Small Pulmonary Nodules Detected on CT
Scans: A Statement from the [HOSPITAL] as published in

## 2013-12-21 SURGERY — EGD (ESOPHAGOGASTRODUODENOSCOPY)
Anesthesia: Moderate Sedation

## 2013-12-21 MED ORDER — NICOTINE 21 MG/24HR TD PT24: 21.0000 mg | MEDICATED_PATCH | Freq: Every day | TRANSDERMAL | Status: DC

## 2013-12-21 MED ORDER — MIDAZOLAM HCL 5 MG/ML IJ SOLN
INTRAMUSCULAR | Status: AC
  Filled 2013-12-21: qty 1

## 2013-12-21 MED ORDER — MIDAZOLAM HCL 5 MG/ML IJ SOLN
INTRAMUSCULAR | Status: AC
  Filled 2013-12-21: qty 2

## 2013-12-21 MED ORDER — BUTAMBEN-TETRACAINE-BENZOCAINE 2-2-14 % EX AERO
INHALATION_SPRAY | CUTANEOUS | Status: DC | PRN
  Administered 2013-12-21: 2 via TOPICAL

## 2013-12-21 MED ORDER — FENTANYL CITRATE 0.05 MG/ML IJ SOLN
INTRAMUSCULAR | Status: DC | PRN
  Administered 2013-12-21 (×5): 25 ug via INTRAVENOUS

## 2013-12-21 MED ORDER — FENTANYL CITRATE 0.05 MG/ML IJ SOLN
INTRAMUSCULAR | Status: AC
  Filled 2013-12-21: qty 2

## 2013-12-21 MED ORDER — DIPHENHYDRAMINE HCL 50 MG/ML IJ SOLN
INTRAMUSCULAR | Status: DC | PRN
  Administered 2013-12-21 (×2): 25 mg via INTRAVENOUS

## 2013-12-21 MED ORDER — MIDAZOLAM HCL 10 MG/2ML IJ SOLN
INTRAMUSCULAR | Status: DC | PRN
  Administered 2013-12-21 (×6): 2 mg via INTRAVENOUS

## 2013-12-21 MED ORDER — DIPHENHYDRAMINE HCL 50 MG/ML IJ SOLN
INTRAMUSCULAR | Status: AC
  Filled 2013-12-21: qty 1

## 2013-12-21 NOTE — Progress Notes (Signed)
12/21/2013 5:24 PM Nursing note Upon return from Endoscopy, pt. Voicing concern about pain management. Pt. States that he takes oxycodone 10 mg four times daily at home for back pain and that if this medication is not prescribed for him here, he will leave AMA. Pt. Advised of hospital policy regarding leaving AMA and MD paged and made aware of pt. Wishes/concerns. MD to see patient.  Emberly Tomasso, Blanchard Kelch

## 2013-12-21 NOTE — Progress Notes (Signed)
12/21/2013 1830 Nursing note Discharge avs form, medications already taken today and those due this evening given and explained to patient. Follow up appointments, when to call MD and location of called in Rx reviewed. D/c iv line by NT. D/c tele. D/c home per orders.  Alexis West, Blanchard Kelch

## 2013-12-21 NOTE — H&P (View-Only) (Signed)
FMTS Attending Daily Note: Marlia Schewe MD [phone] pager office [phone] I have discussed this patient with the resident and reviewed the assessment and plan as documented above. I agree wit the resident's findings and plan.He is undergoing EGD today for further eva of wt loss and we will re-assess after procedure. After review of his telemetry and workup to date, I think his syncope is most likely explained by his orthostasis which I believe is a result of several factors: 1) weight loss and poor intake from his GI issues, underlying cause still  to be determined. 2) illegal drugs (cocaine and benzodiazepines) use and sequelae.  Nothing on telemetry to indicate underlying cardiac issue and no focal neoro signs to indicate TIA / CVA. 

## 2013-12-21 NOTE — Progress Notes (Signed)
Family Medicine Teaching Service Daily Progress Note Intern Pager: [phone]  Patient name: Alexis West Medical record number: [id number] Date of birth: 27-Sep-1976 Age: 37 y.o. Gender: male  Primary Care Provider: Lucilla Edin, MD Consultants: none Code Status: full  Pt Overview and Major Events to Date:  12/22 - Admitted with syncope  Assessment and Plan: Alexis West is a 37 y.o. male presenting with a variety of complaints, syncope, weight loss, and hemoptysis . PMH is significant for HTN, panic attacks, chronic back pain, and benzo withdrawal.   # Syncope: patient presents with several syncopal events over the past week. The patients description would be most consistent with an orthostatic hypotension picture. Though must consider other causes such as ACS, arrhythmia, vasovagal, event, cardiac structural abnormality, drug use.   - AM EKG 12/22 with TWI VI-V3, f/u normal - troponins neg x3 - echo and carotid dopplers pending - UDS positive for benzos and cocaine - TSH 1.164 - orthostatics negative yesterday but pt still with low BO, positive oday based upon increased heart rate today, which I do not think is due to vol status or drugs at this point - No events on telemetry, sinus brady to 49 while sleeping  - consult cards today    # Hemoptysis: patient followed by Dr Kendrick Fries and had recent bronch that did not reveal a source of this issue. Has been referred outpatient to ENT for evaluation of a potential cause. Hgb is normal and on admission vital signs were stable with BP on the low end of normal.  -will monitor for further episodes  -follow hgb on am cbc  -will need to keep follow-up with ENT for further evaluation   # Weight loss: patient with difficulty taking in solid foods and feeling as though these are stuck. Patient notably with negative HIV recently. Potentially there is an esophageal/GI issue leading to inability to swallow appropriately. Patient has been referred  to outpatient GI, though per report he has not followed through with this referral.  -will supplement in hospital with resource  - Plan for EGD today    # Anxiety: will continue home xanax 1 mg PO TID and trazodone for sleep    # Chronic back pain: will continue patient reported oxycodone 10 mg BID prn  - UDS positive for benzos and cocaine, not opiates -would not discharge patient with pain medication given seeking behavior and long list of prescribers in database - Patient given one dose of oxycodone 10 mg last night at 10 PM, do not repeat now   # Tobacco Abuse - > 1 ppd smoker  - Give nicotine patch 21 mg   # Cocaine Abuse, Drug Seeking and like drug diversion  - PT with 21 separate scripts for controlled substances in since August 2014  - Discussed issues of drug addiction with patient who did not acknowledge it and blamed on it pain from pneumonia for 4 months  - Absolutely will not provide controlled substances on discharge.    FEN/GI: full liquid diet, D5 1/2 NS 125 mL/hr  Prophylaxis: SCDs, no SQ heparin given potential bleed   Disposition: homee pending completion of syncope and GI work-up  Subjective: Feeling ok this morning, still having some chest pain  Objective: Temp:  [97.4 F (36.3 C)-98.4 F (36.9 C)] 97.4 F (36.3 C) (12/23 0457) Pulse Rate:  [62-93] 93 (12/23 0542) Resp:  [16-19] 18 (12/23 0457) BP: (95-133)/(54-83) 95/54 mmHg (12/23 0542) SpO2:  [97 %-99 %] 97 % (12/23 0457)  Weight:  [143 lb 4.8 oz (65 kg)] 143 lb 4.8 oz (65 kg) (12/22 1100) Physical Exam: General: NAD, laying comfortably in bed  HEENT: NCAT, mildly dry MM, PERRL, no cervical adenopathy noted, no carotid bruits noted  Cardiovascular: rrr, no murmurs appreciated  Respiratory: CTAB, no wheezes or crackles  Abdomen: s, mild tenderness in RUQ, ND, no guarding or rebound  Extremities: no edema  Skin: scattered tattoos, no noticeable lesions  Neuro: alert, no focal  deficits   Laboratory:  Recent Labs Lab 12/19/13 1335 12/20/13 0230  WBC 5.6 5.9  HGB 16.1 12.5*  HCT 45.5 36.3*  PLT 195 142*    Recent Labs Lab 12/19/13 1335 12/20/13 0230  NA 136 139  K 3.6 3.5  CL 96 106  CO2 27 27  BUN 9 7  CREATININE 1.08 0.87  CALCIUM 10.0 8.5  PROT 7.4  --   BILITOT 0.5  --   ALKPHOS 78  --   ALT 10  --   AST 18  --   GLUCOSE 104* 125*   Trop neg x3  Imaging/Diagnostic Tests: CXR: Findings consist with apical pleural scarring. No acute cardiopulmonary disease. Reference made to chest CT report of 11/17/2013 concerning pulmonary nodule identified in the right pulmonary apex.  Garnetta Buddy, MD 12/21/2013, 9:41 AM PGY-1, [facility] FPTS Intern pager: [phone], text pages welcome

## 2013-12-21 NOTE — Discharge Summary (Signed)
Family Medicine Teaching [facility] Discharge Summary  Patient name: Alexis West Medical record number: [id number] Date of birth: Mar 13, 1976 Age: 37 y.o. Gender: male Date of Admission: 12/19/2013  Date of Discharge: 12/21/2013 ] Admitting Physician: Barbaraann Barthel, MD  Primary Care Provider: Lucilla Edin, MD Consultants: GI  Indication for Hospitalization: syncope  Discharge Diagnoses/Problem List:  Patient Active Problem List   Diagnosis Date Noted  . Chest pain 12/20/2013  . Dysphagia, unspecified(787.20) 12/20/2013  . Loss of weight 12/20/2013  . Protein-calorie malnutrition, severe 12/20/2013  . Syncope 12/19/2013  . Drug-seeking behavior 12/15/2013  . Hemoptysis, unspecified 12/07/2013  . Cough 12/07/2013  . Pulmonary nodules 12/07/2013  . Smoker 10/22/2013  . Anxiety state, unspecified 10/22/2013     Disposition: home  Discharge Condition: stable  Brief Hospital Course:  # Syncope: Patient had multiple episodes of syncope prior to admission. This was thought to be caused by orthostatic hypotension based on the history and positive orthostatic vital signs during admission. He had a normal echo and carotid dopplers.  # His chest pain sounded like GERD vs. MSK. His EKGs showed no ischemic changes and his troponins were negative throughout his stay.  # Dysphagia/weight loss: GI was consulted for his weight loss of 30 lbs over the last 3 months and history of difficulty swallowing. They performed an EGD which was normal.   Issues for Follow Up:  # Hemoptysis: Pt reportedly has been seen by pulm and referred to ENT but has not followed up with this. If he continues to have issues that would be the next step.  # Chronic pain: He has received 19 prescriptions for narcotics over the past 3 months from 6 different providers. If he requires chronic narcotics they should be prescribed by a single provider to prevent abuse. In addition, he was opiate negative and  cocaine positive on admission, leading me to believe that he is likely diverting his opiates.  Significant Procedures: EGD  Significant Labs and Imaging:   Recent Labs Lab 12/19/13 1335 12/20/13 0230  WBC 5.6 5.9  HGB 16.1 12.5*  HCT 45.5 36.3*  PLT 195 142*    Recent Labs Lab 12/19/13 1335 12/20/13 0230  NA 136 139  K 3.6 3.5  CL 96 106  CO2 27 27  GLUCOSE 104* 125*  BUN 9 7  CREATININE 1.08 0.87  CALCIUM 10.0 8.5  ALKPHOS 78  --   AST 18  --   ALT 10  --   ALBUMIN 4.4  --    Trop neg x3    CXR: Findings consist with apical pleural scarring. No acute cardiopulmonary disease. Reference made to chest CT report of 11/17/2013 concerning pulmonary nodule identified in the right pulmonary apex.   Outstanding Results: EGD biopsy pathology  Discharge Medications:    Medication List         albuterol 108 (90 BASE) MCG/ACT inhaler  Commonly known as:  PROVENTIL HFA;VENTOLIN HFA  Inhale 2 puffs into the lungs every 6 (six) hours as needed for wheezing.     ALPRAZolam 1 MG tablet  Commonly known as:  XANAX  Take 1 mg by mouth 3 (three) times daily.     chlorpheniramine-HYDROcodone 10-8 MG/5ML Lqcr  Commonly known as:  TUSSIONEX PENNKINETIC ER  Take 5 mLs by mouth every 12 (twelve) hours as needed for cough (cough).     naproxen sodium 220 MG tablet  Commonly known as:  ANAPROX  Take 440 mg by mouth as needed (fever).  nicotine 21 mg/24hr patch  Commonly known as:  NICODERM CQ - dosed in mg/24 hours  Place 1 patch (21 mg total) onto the skin daily.     ondansetron 4 MG disintegrating tablet  Commonly known as:  ZOFRAN ODT  Take 1 tablet (4 mg total) by mouth every 8 (eight) hours as needed for nausea or vomiting.     Oxycodone HCl 10 MG Tabs  Take 10 mg by mouth every 12 (twelve) hours as needed.     traZODone 100 MG tablet  Commonly known as:  DESYREL  Take 200 mg by mouth at bedtime.        Discharge Instructions: Please refer to Patient  Instructions section of EMR for full details.  Patient was counseled important signs and symptoms that should prompt return to medical care, changes in medications, dietary instructions, activity restrictions, and follow up appointments.   Follow-Up Appointments: Follow-up Information   Follow up with DAUB, STEVE A, MD In 1 week.   Specialty:  Family Medicine   Contact information:   [address] [place] [address] [phone]       Follow up with ENT.   Contact information:   Please call to reschedule for your missed appointment      Beverely Low, MD 12/21/2013, 9:56 PM PGY-1, [facility] Family [facility]

## 2013-12-21 NOTE — Progress Notes (Signed)
FMTS Attending Daily Note: Denny Levy MD [phone] pager office [phone] I have discussed this patient with the resident and reviewed the assessment and plan as documented above. I agree wit the resident's findings and plan.He is undergoing EGD today for further eva of wt loss and we will re-assess after procedure. After review of his telemetry and workup to date, I think his syncope is most likely explained by his orthostasis which I believe is a result of several factors: 1) weight loss and poor intake from his GI issues, underlying cause still  to be determined. 2) illegal drugs (cocaine and benzodiazepines) use and sequelae.  Nothing on telemetry to indicate underlying cardiac issue and no focal neoro signs to indicate TIA / CVA.

## 2013-12-21 NOTE — H&P (View-Only) (Signed)
Family Medicine Teaching Service Daily Progress Note Intern Pager: [phone]  Patient name: Alexis West Medical record number: [id number] Date of birth: 09/27/1976 Age: 37 y.o. Gender: male  Primary Care Provider: DAUB, Alexis A, MD Consultants: none Code Status: full  Pt Overview and Major Events to Date:  12/22 - Admitted with syncope  Assessment and Plan: Alexis West is a 37 y.o. male presenting with a variety of complaints, syncope, weight loss, and hemoptysis . PMH is significant for HTN, panic attacks, chronic back pain, and benzo withdrawal.   # Syncope: patient presents with several syncopal events over the past week. The patients description would be most consistent with an orthostatic hypotension picture. Though must consider other causes such as ACS, arrhythmia, vasovagal, event, cardiac structural abnormality, drug use.   - AM EKG 12/22 with TWI VI-V3, f/u normal - troponins neg x3 - echo and carotid dopplers pending - UDS positive for benzos and cocaine - TSH 1.164 - orthostatics negative yesterday but pt still with low BO, positive oday based upon increased heart rate today, which I do not think is due to vol status or drugs at this point - No events on telemetry, sinus brady to 49 while sleeping  - consult cards today    # Hemoptysis: patient followed by Alexis West and had recent bronch that did not reveal a source of this issue. Has been referred outpatient to ENT for evaluation of a potential cause. Hgb is normal and on admission vital signs were stable with BP on the low end of normal.  -will monitor for further episodes  -follow hgb on am cbc  -will need to keep follow-up with ENT for further evaluation   # Weight loss: patient with difficulty taking in solid foods and feeling as though these are stuck. Patient notably with negative HIV recently. Potentially there is an esophageal/GI issue leading to inability to swallow appropriately. Patient has been referred  to outpatient GI, though per report he has not followed through with this referral.  -will supplement in hospital with resource  - Plan for EGD today    # Anxiety: will continue home xanax 1 mg PO TID and trazodone for sleep    # Chronic back pain: will continue patient reported oxycodone 10 mg BID prn  - UDS positive for benzos and cocaine, not opiates -would not discharge patient with pain medication given seeking behavior and long list of prescribers in database - Patient given one dose of oxycodone 10 mg last night at 10 PM, do not repeat now   # Tobacco Abuse - > 1 ppd smoker  - Give nicotine patch 21 mg   # Cocaine Abuse, Drug Seeking and like drug diversion  - PT with 21 separate scripts for controlled substances in since August 2014  - Discussed issues of drug addiction with patient who did not acknowledge it and blamed on it pain from pneumonia for 4 months  - Absolutely will not provide controlled substances on discharge.    FEN/GI: full liquid diet, D5 1/2 NS 125 mL/hr  Prophylaxis: SCDs, no SQ heparin given potential bleed   Disposition: homee pending completion of syncope and GI work-up  Subjective: Feeling ok this morning, still having some chest pain  Objective: Temp:  [97.4 F (36.3 C)-98.4 F (36.9 C)] 97.4 F (36.3 C) (12/23 0457) Pulse Rate:  [62-93] 93 (12/23 0542) Resp:  [16-19] 18 (12/23 0457) BP: (95-133)/(54-83) 95/54 mmHg (12/23 0542) SpO2:  [97 %-99 %] 97 % (12/23 0457)   Weight:  [143 lb 4.8 oz (65 kg)] 143 lb 4.8 oz (65 kg) (12/22 1100) Physical Exam: General: NAD, laying comfortably in bed  HEENT: NCAT, mildly dry MM, PERRL, no cervical adenopathy noted, no carotid bruits noted  Cardiovascular: rrr, no murmurs appreciated  Respiratory: CTAB, no wheezes or crackles  Abdomen: s, mild tenderness in RUQ, ND, no guarding or rebound  Extremities: no edema  Skin: scattered tattoos, no noticeable lesions  Neuro: alert, no focal  deficits   Laboratory:  Recent Labs Lab 12/19/13 1335 12/20/13 0230  WBC 5.6 5.9  HGB 16.1 12.5*  HCT 45.5 36.3*  PLT 195 142*    Recent Labs Lab 12/19/13 1335 12/20/13 0230  NA 136 139  K 3.6 3.5  CL 96 106  CO2 27 27  BUN 9 7  CREATININE 1.08 0.87  CALCIUM 10.0 8.5  PROT 7.4  --   BILITOT 0.5  --   ALKPHOS 78  --   ALT 10  --   AST 18  --   GLUCOSE 104* 125*   Trop neg x3  Imaging/Diagnostic Tests: CXR: Findings consist with apical pleural scarring. No acute cardiopulmonary disease. Reference made to chest CT report of 11/17/2013 concerning pulmonary nodule identified in the right pulmonary apex.  Fredric Slabach V Skyrah Krupp, MD 12/21/2013, 9:41 AM PGY-1, [facility] FPTS Intern pager: [phone], text pages welcome  

## 2013-12-21 NOTE — Op Note (Signed)
Eligha Bridegroom [facility] [address] [place], [address]   OPERATIVE PROCEDURE REPORT  PATIENT: Alexis West, Alexis West  MR#: [id number] BIRTHDATE: 12/30/1976  GENDER: Male ENDOSCOPIST: Jeani Hawking, MD ASSISTANT:   Nilsa Nutting, Endo Technician, Jamal Maes, RN, and Kandice Robinsons, technician PROCEDURE DATE: 12/21/2013 PROCEDURE:   EGD w/ biopsy ASA CLASS:   Class II INDICATIONS:Dysphagia and Weight loss. MEDICATIONS: Versed 12 mg IV, Fentanyl 125 mcg IV, and Benadryl 50 mg IV TOPICAL ANESTHETIC:   Cetacaine Spray  DESCRIPTION OF PROCEDURE:   After the risks benefits and alternatives of the procedure were thoroughly explained, informed consent was obtained.  The Pentax Gastroscope Peds [id number] endoscope was introduced through the mouth  and advanced to the second portion of the duodenum Without limitations.      The instrument was slowly withdrawn as the mucosa was fully examined.    FINDINGS:  The upper, middle and distal third of the esophagus were carefully inspected and no abnormalities were noted.  The z-line was well seen at the GEJ.  Random esophageal biopsies were obtained in the mid to upper esophagus.  No evidence of any strictures or esophagitis.  The endoscope was pushed into the fundus which was normal including a retroflexed view.  The antrum, gastric body, first and second part of the duodenum were unremarkable. Retroflexed views revealed no abnormalities.     The scope was then withdrawn from the patient and the procedure terminated.  COMPLICATIONS: There were no complications. IMPRESSION:Normal EGD  RECOMMENDATIONS: 1) Await biopsy results.  _______________________________ eSignedJeani Hawking, MD 12/21/2013 4:52 PM

## 2013-12-21 NOTE — Telephone Encounter (Signed)
Called again, left another message to advise if he needs anything to call back.

## 2013-12-21 NOTE — Interval H&P Note (Signed)
History and Physical Interval Note:  12/21/2013 4:23 PM  Alexis West  has presented today for surgery, with the diagnosis of Dysphagia  The various methods of treatment have been discussed with the patient and family. After consideration of risks, benefits and other options for treatment, the patient has consented to  Procedure(s): ESOPHAGOGASTRODUODENOSCOPY (EGD) (N/A) as a surgical intervention .  The patient's history has been reviewed, patient examined, no change in status, stable for surgery.  I have reviewed the patient's chart and labs.  Questions were answered to the patient's satisfaction.     Soleia Badolato D

## 2013-12-21 NOTE — Telephone Encounter (Signed)
Patient returned [name]'s call. He states he has been trying to get in touch with Dr. Cleta Alberts. Please return call in reference to pain in his back. Thank you.

## 2013-12-22 ENCOUNTER — Encounter (HOSPITAL_COMMUNITY): Payer: Self-pay | Admitting: Gastroenterology

## 2013-12-22 NOTE — Discharge Summary (Signed)
Family Medicine Teaching Service  Discharge Note : Attending Chasitie Passey MD Pager [phone] Office [phone] I have seen and examined this patient, reviewed their chart and discussed discharge planning wit the resident at the time of discharge. I agree with the discharge plan as above.  

## 2013-12-22 NOTE — Telephone Encounter (Signed)
Lm for rtn call 

## 2013-12-27 NOTE — Telephone Encounter (Signed)
Multiple messages have been left for him, he still has not returned them

## 2014-01-04 LAB — AFB CULTURE WITH SMEAR (NOT AT ARMC): Acid Fast Smear: NONE SEEN

## 2014-01-05 ENCOUNTER — Ambulatory Visit (INDEPENDENT_AMBULATORY_CARE_PROVIDER_SITE_OTHER): Payer: BC Managed Care – PPO | Admitting: Emergency Medicine

## 2014-01-05 VITALS — BP 120/78 | HR 104 | Temp 98.7°F | Resp 18 | Ht 70.5 in | Wt 148.0 lb

## 2014-01-05 DIAGNOSIS — F1910 Other psychoactive substance abuse, uncomplicated: Secondary | ICD-10-CM

## 2014-01-05 DIAGNOSIS — G894 Chronic pain syndrome: Secondary | ICD-10-CM

## 2014-01-05 DIAGNOSIS — M549 Dorsalgia, unspecified: Secondary | ICD-10-CM

## 2014-01-05 MED ORDER — ALPRAZOLAM 1 MG PO TABS: 1.0000 mg | ORAL_TABLET | Freq: Two times a day (BID) | ORAL | Status: DC

## 2014-01-05 MED ORDER — OXYCODONE HCL 10 MG PO TABS: 10.0000 mg | ORAL_TABLET | Freq: Two times a day (BID) | ORAL | Status: DC | PRN

## 2014-01-05 NOTE — Patient Instructions (Signed)
[facility] Policy for Prescribing Controlled Substances (Revised 10/2012) 1. Prescriptions for controlled substances will be filled by ONE provider at [facility] with whom you have established and developed a plan for your care, including follow-up. 2. You are encouraged to schedule an appointment with your prescriber at our appointment center for follow-up visits whenever possible. 3. If you request a prescription for the controlled substance while at [facility] for an acute problem (with someone other than your regular prescriber), you MAY be given a ONE-TIME prescription for a 30-day supply of the controlled substance, to allow time for you to return to see your regular prescriber for additional prescriptions. 

## 2014-01-05 NOTE — Progress Notes (Addendum)
   Subjective:    Patient ID: Alexis West, male    DOB: 1976/05/19, 38 y.o.   MRN: [id number] This chart was scribed for Dr. Elita Quickaubb, MD by Landis GandyJewell Dinkins, ED Scribe. This patient was seen in room 8 and the patient's care was started at 8:58 AM.  HPI  HPI Comments: Alexis LoaCarlton Hiser is a 38 y.o. male who presents to the Emergency Department complaining of gradually worsening, constant back pain, that resulted from a fall this past summer. He states that it begins in his mid- back and radiates to his posterior leg. He has participated in physical therapy  In the past, but has not had any relief.  Pt reports that he has previously been released from the hospital, for having two procedures on his lungs. He also states that he had experienced an abnormal weight loss of 30 pounds, but has gained 10 pounds since being out of the hospital. Pt denies any emesis, or productive cough.   Review of Systems  Respiratory: Negative for cough.   Gastrointestinal: Negative for vomiting.  Musculoskeletal: Positive for back pain.  All other systems reviewed and are negative.       Objective:   Physical Exam Nursing note and vitals reviewed. Constitutional: He is oriented to person, place, and time. He appears well-developed and well-nourished. No distress.  HENT:  Head: Normocephalic and atraumatic.  Eyes: Conjunctivae and EOM are normal. No scleral icterus.  Neck: Normal range of motion.  Pulmonary/Chest: Effort normal. No respiratory distress.  Musculoskeletal: Patient has decreased range of motion of his back. He has diffuse tenderness along his paracervical parathoracic and paralumbar pain there are no focal neurological signs  .  Neurological: He is alert and oriented to person, place, and time.  Skin: Skin is warm and dry. No rash noted. He is not diaphoretic. No erythema. No pallor.  Psychiatric: He has a normal mood and affect. His behavior is normal.   DIAGNOSTIC STUDIES: Oxygen Saturation  is 97% on RA, normal by my interpretation.    COORDINATION OF CARE: 9:08 AM- Pt advised of plan for treatment and pt agrees.     Assessment & Plan:   Patient given a two-week supply of oxycodone and Xanax. He can take oxycodone 10 twice a day   He was given our substance abuse policy here. I did send off a urine drug screen here. Referral made to Dr. Roylene ReasoneMont ski to recheck his back and get him restarted on physical therapy and referral made to pain management to help with his care. Recheck two weeks. He is under the care of a psychiatrist for his anxiety disorder

## 2014-01-11 LAB — FUNGUS CULTURE W SMEAR
Fungal Smear: NONE SEEN
Special Requests: NORMAL

## 2014-01-19 ENCOUNTER — Ambulatory Visit (INDEPENDENT_AMBULATORY_CARE_PROVIDER_SITE_OTHER): Payer: BC Managed Care – PPO | Admitting: Emergency Medicine

## 2014-01-19 VITALS — BP 102/70 | HR 92 | Temp 98.5°F | Resp 18 | Ht 69.5 in | Wt 146.0 lb

## 2014-01-19 DIAGNOSIS — F411 Generalized anxiety disorder: Secondary | ICD-10-CM

## 2014-01-19 DIAGNOSIS — M549 Dorsalgia, unspecified: Secondary | ICD-10-CM

## 2014-01-19 DIAGNOSIS — G894 Chronic pain syndrome: Secondary | ICD-10-CM

## 2014-01-19 MED ORDER — OXYCODONE HCL 10 MG PO TABS: 10.0000 mg | ORAL_TABLET | Freq: Two times a day (BID) | ORAL | Status: DC | PRN

## 2014-01-19 NOTE — Patient Instructions (Signed)
Chronic Back Pain   When back pain lasts longer than 3 months, it is called chronic back pain.People with chronic back pain often go through certain periods that are more intense (flare-ups).   CAUSES  Chronic back pain can be caused by wear and tear (degeneration) on different structures in your back. These structures include:   The bones of your spine (vertebrae) and the joints surrounding your spinal cord and nerve roots (facets).   The strong, fibrous tissues that connect your vertebrae (ligaments).  Degeneration of these structures may result in pressure on your nerves. This can lead to constant pain.  HOME CARE INSTRUCTIONS   Avoid bending, heavy lifting, prolonged sitting, and activities which make the problem worse.   Take brief periods of rest throughout the day to reduce your pain. Lying down or standing usually is better than sitting while you are resting.   Take over-the-counter or prescription medicines only as directed by your caregiver.  SEEK IMMEDIATE MEDICAL CARE IF:    You have weakness or numbness in one of your legs or feet.   You have trouble controlling your bladder or bowels.   You have nausea, vomiting, abdominal pain, shortness of breath, or fainting.  Document Released: 01/23/2005 Document Revised: 03/09/2012 Document Reviewed: 11/30/2011  ExitCare Patient Information 2014 [employer].

## 2014-01-19 NOTE — Progress Notes (Signed)
[facility] [address], [place] [address] [phone]  Date:  01/19/2014   Name:  Alexis West   DOB:  1976/08/05   MRN:  [id number]  PCP:  Lucilla EdinAUB, STEVE A, MD    Chief Complaint: Follow-up   History of Present Illness:  Alexis West is a 38 y.o. very pleasant male patient who presents with the following:  Patient claims a history of "back pain" since his teens.  Has been under treatment by Dr Cleta Albertsaub and has signed a pain contract, knows to see only Dr Cleta Albertsaub.  He requests a refill of his percocet for a month as his insurance is due to expire and will not have insurance again until March.  He was referred to ortho and pain management two weeks ago and has seen neither Research scientist (medical)consultant.  Review of his charts show multiple procedures with no diagnosis and no imaging results to support his chronic pain.  Spoke with Dr Cleta Albertsaub and he reinforced the need for patient to come in to see him only for refills.  Denies other complaint or health concern today.   Patient Active Problem List   Diagnosis Date Noted  . Chest pain 12/20/2013  . Dysphagia, unspecified(787.20) 12/20/2013  . Loss of weight 12/20/2013  . Protein-calorie malnutrition, severe 12/20/2013  . Syncope 12/19/2013  . Drug-seeking behavior 12/15/2013  . Hemoptysis, unspecified 12/07/2013  . Cough 12/07/2013  . Pulmonary nodules 12/07/2013  . Smoker 10/22/2013  . Anxiety state, unspecified 10/22/2013    Past Medical History  Diagnosis Date  . Hypertension   . Panic attack   . Chronic back pain   . Seizures     Benzo withdrawal seizure 2004.    Past Surgical History  Procedure Laterality Date  . Video bronchoscopy Bilateral 12/15/2013    Procedure: VIDEO BRONCHOSCOPY WITHOUT FLUORO;  Surgeon: Lupita Leashouglas B McQuaid, MD;  Location: [facility] END[facility]COPY;  Service: Cardiopulmonary;  Laterality: Bilateral;  . Esophagogastroduodenoscopy N/A 12/21/2013    Procedure: ESOPHAGOGASTRODUODENOSCOPY (EGD);  Surgeon: Theda BelfastPatrick  D Hung, MD;  [facility];  Service: Endoscopy;  Laterality: N/A;    History  Substance Use Topics  . Smoking status: Current Every Day Smoker -- 0.50 packs/day    Types: Cigarettes    Start date: 12/30/1988  . Smokeless tobacco: Never Used     Comment: Pt has smoked on and off since he was 13.  Pt has this time been smoking since 08/2012  . Alcohol Use: No    Family History  Problem Relation Age of Onset  . Hypertension Father     Allergies  Allergen Reactions  . Morphine And Related     blisters  . Penicillins     Nausea, vomiting, nose bleeds    Medication list has been reviewed and updated.  Current Outpatient Prescriptions on File Prior to Visit  Medication Sig Dispense Refill  . ALPRAZolam (XANAX) 1 MG tablet Take 1 tablet (1 mg total) by mouth 2 (two) times daily.  28 tablet  0  . naproxen sodium (ANAPROX) 220 MG tablet Take 440 mg by mouth as needed (fever).      . Oxycodone HCl 10 MG TABS Take 1 tablet (10 mg total) by mouth every 12 (twelve) hours as needed.  28 tablet  0  . traZODone (DESYREL) 100 MG tablet Take 200 mg by mouth at bedtime.       Current Facility-Administered Medications on File Prior to Visit  Medication Dose Route Frequency Provider Last Rate  Last Dose  . ondansetron (ZOFRAN-ODT) disintegrating tablet 16 mg  16 mg Oral Once Pearline Cables, MD        Review of Systems:  As per HPI, otherwise negative.    Physical Examination: Filed Vitals:   01/19/14 1747  BP: 102/70  Pulse: 92  Temp: 98.5 F (36.9 C)  Resp: 18   Filed Vitals:   01/19/14 1747  Height: 5' 9.5" (1.765 m)  Weight: 146 lb (66.225 kg)   Body mass index is 21.26 kg/(m^2). Ideal Body Weight: Weight in (lb) to have BMI = 25: 171.4   GEN: WDWN, NAD, Non-toxic, Alert & Oriented x 3 HEENT: Atraumatic, Normocephalic.  Ears and Nose: No external deformity. EXTR: No clubbing/cyanosis/edema NEURO: Normal gait.  PSYCH: Normally interactive. Conversant. Not  depressed or anxious appearing.  Calm demeanor.    Assessment and Plan: Chronic pain Prescription for four pain pills Follow up with Dr Cleta Alberts in AM   Signed,  Phillips Odor, MD

## 2014-01-20 ENCOUNTER — Ambulatory Visit (INDEPENDENT_AMBULATORY_CARE_PROVIDER_SITE_OTHER): Payer: BC Managed Care – PPO | Admitting: Emergency Medicine

## 2014-01-20 VITALS — BP 128/78 | HR 81 | Temp 98.1°F | Resp 16 | Ht 69.5 in | Wt 149.0 lb

## 2014-01-20 DIAGNOSIS — M549 Dorsalgia, unspecified: Secondary | ICD-10-CM

## 2014-01-20 LAB — AFB CULTURE WITH SMEAR (NOT AT ARMC): Acid Fast Smear: NONE SEEN

## 2014-01-20 MED ORDER — OXYCODONE HCL 10 MG PO TABS: 10.0000 mg | ORAL_TABLET | Freq: Two times a day (BID) | ORAL | Status: DC | PRN

## 2014-01-20 NOTE — Progress Notes (Signed)
Subjective:    Patient ID: Alexis West, male    DOB: Nov 11, 1976, 38 y.o.   MRN: [id number]  HPI This chart was scribed for Viviann Spare Marvell Tamer-MD, by Ladona Ridgel Day, Scribe. This patient was seen in room 1 and the patient's care was started at 12:23 PM.  HPI Comments: Alexis West is a 38 y.o. male who [facility] complaining of chronic back pain which has recently aggravated after he tweaked it while walking up the stairs. He reports that his regular medicines are xanax and 2 pain pills per day. He states already has a referral to pain management and to Dr. Yevette Edwards. He reports was recently laid off.    Patient Active Problem List   Diagnosis Date Noted  . Chest pain 12/20/2013  . Dysphagia, unspecified(787.20) 12/20/2013  . Loss of weight 12/20/2013  . Protein-calorie malnutrition, severe 12/20/2013  . Syncope 12/19/2013  . Drug-seeking behavior 12/15/2013  . Hemoptysis, unspecified 12/07/2013  . Cough 12/07/2013  . Pulmonary nodules 12/07/2013  . Smoker 10/22/2013  . Anxiety state, unspecified 10/22/2013    Past Surgical History  Procedure Laterality Date  . Video bronchoscopy Bilateral 12/15/2013    Procedure: VIDEO BRONCHOSCOPY WITHOUT FLUORO;  Surgeon: Lupita Leash, MD;  Location: [facility] ENDOSCOPY;  Service: Cardiopulmonary;  Laterality: Bilateral;  . Esophagogastroduodenoscopy N/A 12/21/2013    Procedure: ESOPHAGOGASTRODUODENOSCOPY (EGD);  Surgeon: Theda Belfast, MD;  Location: [facility] ENDOSCOPY;  Service: Endoscopy;  Laterality: N/A;    Family History  Problem Relation Age of Onset  . Hypertension Father     History   Social History  . Marital Status: Legally Separated    Spouse Name: N/A    Number of Children: N/A  . Years of Education: N/A   Occupational History  . Not on file.   Social History Main Topics  . Smoking status: Current Every Day Smoker -- 0.50 packs/day    Types: Cigarettes    Start date: 12/30/1988  .  Smokeless tobacco: Never Used     Comment: Pt has smoked on and off since he was 13.  Pt has this time been smoking since 08/2012  . Alcohol Use: No  . Drug Use: Yes    Special: Cocaine     Comment: last time used was last week end  . Sexual Activity: Not on file   Other Topics Concern  . 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      Tobacco:  1/2 ppd      Alcohol: none; last alcohol years ago.      Drugs:  None             Allergies  Allergen Reactions  . Morphine And Related     blisters  . Penicillins     Nausea, vomiting, nose bleeds    Results for orders placed during the hospital encounter of 12/19/13  URINE RAPID DRUG SCREEN (HOSP PERFORMED)      Result Value Range   Opiates NONE DETECTED  NONE DETECTED   Cocaine POSITIVE (*) NONE DETECTED   Benzodiazepines POSITIVE (*) NONE DETECTED   Amphetamines NONE DETECTED  NONE DETECTED   Tetrahydrocannabinol NONE DETECTED  NONE DETECTED   Barbiturates NONE DETECTED  NONE DETECTED  TROPONIN I      Result Value Range   Troponin I <0.30  <0.30 ng/mL  TROPONIN I      Result Value Range   Troponin I <0.30  <0.30 ng/mL  TSH      Result Value Range   TSH 0.566  0.350 - 4.500 uIU/mL  BASIC METABOLIC PANEL      Result Value Range   Sodium 139  135 - 145 mEq/L   Potassium 3.5  3.5 - 5.1 mEq/L   Chloride 106  96 - 112 mEq/L   CO2 27  19 - 32 mEq/L   Glucose, Bld 125 (*) 70 - 99 mg/dL   BUN 7  6 - 23 mg/dL   Creatinine, Ser 4.090.87  0.50 - 1.35 mg/dL   Calcium 8.5  8.4 - 81.110.5 mg/dL   GFR calc non Af Amer >90  >90 mL/min   GFR calc Af Amer >90  >90 mL/min  CBC      Result Value Range   WBC 5.9  4.0 - 10.5 K/uL   RBC 4.09 (*) 4.22 - 5.81 MIL/uL   Hemoglobin 12.5 (*) 13.0 - 17.0 g/dL   HCT 91.436.3 (*) 78.239.0 - 95.652.0 %   MCV 88.8  78.0 -  100.0 fL   MCH 30.6  26.0 - 34.0 pg   MCHC 34.4  30.0 - 36.0 g/dL   RDW 21.312.7  08.611.5 - 57.815.5 %   Platelets 142 (*) 150 - 400 K/uL  TROPONIN I      Result Value Range   Troponin I <0.30  <0.30 ng/mL   Review of Systems  Constitutional: Negative for fever and chills.  Respiratory: Negative for cough and shortness of breath.   Cardiovascular: Negative for chest pain.  Gastrointestinal: Negative for abdominal pain.  Musculoskeletal: Negative for back pain.       Objective:   Physical Exam Patient is cooperative. Chest was clear. Examination of the back reveals tenderness in the paralumbar muscles on both sides. Straight leg raising is positive at 75 both legs. There's no focal weakness noted.       Assessment & Plan:  Patient has appointment to be seen in pain management in March. Patient has been notified by Dr. Marshell Levanumonski's office about an appointment. He was given 2 weeks of medication #28 Percocet. He will need to see me in 2 weeks. He was told that I would be the only provider that would be able to write for any medications for him.

## 2014-01-22 LAB — AFB CULTURE WITH SMEAR (NOT AT ARMC): Acid Fast Smear: NONE SEEN

## 2014-01-27 LAB — AFB CULTURE WITH SMEAR (NOT AT ARMC)
Acid Fast Smear: NONE SEEN
Special Requests: NORMAL

## 2014-01-28 ENCOUNTER — Encounter: Payer: Self-pay | Admitting: Emergency Medicine

## 2014-02-09 ENCOUNTER — Ambulatory Visit (INDEPENDENT_AMBULATORY_CARE_PROVIDER_SITE_OTHER): Payer: BC Managed Care – PPO | Admitting: Emergency Medicine

## 2014-02-09 VITALS — BP 100/60 | HR 91 | Temp 97.9°F | Resp 16 | Ht 70.25 in | Wt 153.0 lb

## 2014-02-09 DIAGNOSIS — Z0489 Encounter for examination and observation for other specified reasons: Secondary | ICD-10-CM

## 2014-02-09 DIAGNOSIS — M549 Dorsalgia, unspecified: Secondary | ICD-10-CM

## 2014-02-09 DIAGNOSIS — F1120 Opioid dependence, uncomplicated: Secondary | ICD-10-CM

## 2014-02-09 MED ORDER — OXYCODONE HCL 10 MG PO TABS: 10.0000 mg | ORAL_TABLET | Freq: Two times a day (BID) | ORAL | Status: DC | PRN

## 2014-02-09 NOTE — Progress Notes (Signed)
   Subjective:    Patient ID: Alexis West, male    DOB: [birth year], 38 y.o.   MRN: [id number]  HPI Alexis West is presenting today with chronic back pain. He states that he has not seen Dr Alexis West as requested due to his insurance issue. He states he is waiting for it to start. He states he has used the complete 2 week supply of Oxycodone to assist in managing his pain. 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 He is also starting school this fall in which he wants to assist in addiction therapy. He states his pain level is high. He was seeing Dr Alexis West but is wanting to switch to Dr Alexis West. His appetite has increased in which he has gained almost 6 lbs.     Review of Systems     Objective:   Physical Exam patient is alert and cooperative but somewhat withdrawn. He is cooperative and oriented. No focal neurological signs. He has tenderness over the lumbar spine. Deep tendon reflexes are symmetrical. Motor strength is 5 out of 5 in the lower extremities.  Drug screen done for medical purposes was positive for benzos and opiates negative for cocaine. Repeat blood pressure was 110/70.     Assessment & Plan:  I told the patient and he still needs to proceed with his back evaluation in that we cannot continue to write for his pain medications. He was given a two-week supply of medications to see me in 2 weeks . 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 Patient was able to make his pain medications last 3 weeks instead of 2 weeks. He plans to see Dr. Yevette West  once his insurance takes effect. He is enrolled in a drug treatment educational program by computer. His spirits have been good he has not been suicidal. Drug screen done here was positive for benzos and opiates. Negative for other drugs. Drug datasheet was pulled and he is not receiving prescriptions from any other orifices.

## 2014-04-06 ENCOUNTER — Emergency Department: Payer: Self-pay | Admitting: Emergency Medicine

## 2014-04-06 LAB — BASIC METABOLIC PANEL
Anion Gap: 3 — ABNORMAL LOW (ref 7–16)
BUN: 4 mg/dL — AB (ref 7–18)
CREATININE: 0.84 mg/dL (ref 0.60–1.30)
Calcium, Total: 8.9 mg/dL (ref 8.5–10.1)
Chloride: 105 mmol/L (ref 98–107)
Co2: 31 mmol/L (ref 21–32)
EGFR (African American): >60
Glucose: 85 mg/dL (ref 65–99)
OSMOLALITY: 274 (ref 275–301)
POTASSIUM: 3.7 mmol/L (ref 3.5–5.1)
SODIUM: 139 mmol/L (ref 136–145)

## 2014-04-06 LAB — CBC
HCT: 43.1 % (ref 40.0–52.0)
HGB: 14.9 g/dL (ref 13.0–18.0)
MCH: 30.6 pg (ref 26.0–34.0)
MCHC: 34.6 g/dL (ref 32.0–36.0)
MCV: 89 fL (ref 80–100)
Platelet: 196 10*3/uL (ref 150–440)
RBC: 4.87 10*6/uL (ref 4.40–5.90)
RDW: 13.6 % (ref 11.5–14.5)
WBC: 8.1 10*3/uL (ref 3.8–10.6)

## 2014-04-06 LAB — TROPONIN I: Troponin-I: <0.02 ng/mL

## 2014-04-06 LAB — CK: CK, Total: 64 U/L

## 2014-05-18 ENCOUNTER — Other Ambulatory Visit: Payer: BC Managed Care – PPO

## 2014-05-25 ENCOUNTER — Encounter: Payer: Self-pay | Admitting: Emergency Medicine

## 2014-05-25 ENCOUNTER — Telehealth: Payer: Self-pay

## 2014-05-25 ENCOUNTER — Ambulatory Visit (INDEPENDENT_AMBULATORY_CARE_PROVIDER_SITE_OTHER): Payer: Self-pay | Admitting: Emergency Medicine

## 2014-05-25 VITALS — BP 128/80 | HR 122 | Temp 98.4°F | Resp 18 | Ht 71.0 in | Wt 148.0 lb

## 2014-05-25 DIAGNOSIS — S92309A Fracture of unspecified metatarsal bone(s), unspecified foot, initial encounter for closed fracture: Secondary | ICD-10-CM

## 2014-05-25 DIAGNOSIS — Z0489 Encounter for examination and observation for other specified reasons: Secondary | ICD-10-CM

## 2014-05-25 NOTE — Patient Instructions (Signed)
We are not able to write for pain medication at the present time. I would advise that you would see Dr. Yevette Edwards or schedule with Dr.  Jordan Likes [facility].

## 2014-05-25 NOTE — Progress Notes (Addendum)
Subjective:  This chart was scribed for Viviann SpareSteven A. Janita Camberos MD,   by Ashley JacobsBrittany Andrews, [facility] Scribe. The patient was seen in room 11 and the patient's care was started at 2:02 PM.  Patient ID: Alexis West, male    DOB: [birth year], 38 y.o.   MRN: [id number]   Chief Complaint  Patient presents with  . Back Pain    lower x 2 wks   Back Pain   HPI Comments: Alexis West is a 38 y.o. male who arrives to the [facility] complaining of lower back pain, onset two weeks ago. Pt reports going to the beach and pulling carpet up on a home while on vacation. Pt's sister owns a con[place] in [place] and frequently takes trips down there. Pt went to an orthopedist and was given a  "little pink pill " (he mentions oxycodone).  Pt lost a significant amount of weight and has not gained it back yet.  Denies hemoptysis. Pt is back in school taking online courses for psychology. Denies taking any other pain medication since February.  He did not follow up with pain management but he states he is going to. He does not have current employment.    Review of Systems  Constitutional: Positive for unexpected weight change.  Musculoskeletal: Positive for back pain.       Objective:   Physical Exam CONSTITUTIONAL: The patient was disheveled appearing but alert cooperative. HEAD: Normocephalic/atraumatic EYES: EOMI/PERRL ENMT: Mucous membranes moist NECK: supple no meningeal signs SPINE:entire spine nontender there is tenderness in the midline over the thoracic and upper lumbar area. CV: S1/S2 noted, no murmurs/rubs/gallops noted LUNGS: Lungs are clear to auscultation bilaterally, no apparent distress ABDOMEN: soft, nontender, no rebound or guarding GU:no cva tenderness NEURO: Pt is awake/alert, moves all extremitiesx4. There are no focal neurological signs of the upper or lower extremities. EXTREMITIES: pulses normal, full ROM SKIN: warm, color normal. There are no  needle marks. PSYCH: no abnormalities of mood noted  Results for orders placed during the hospital encounter of 12/19/13  URINE RAPID DRUG SCREEN (HOSP PERFORMED)      Result Value Ref Range   Opiates NONE DETECTED  NONE DETECTED   Cocaine POSITIVE (*) NONE DETECTED   Benzodiazepines POSITIVE (*) NONE DETECTED   Amphetamines NONE DETECTED  NONE DETECTED   Tetrahydrocannabinol NONE DETECTED  NONE DETECTED   Barbiturates NONE DETECTED  NONE DETECTED  TROPONIN I      Result Value Ref Range   Troponin I <0.30  <0.30 ng/mL  TROPONIN I      Result Value Ref Range   Troponin I <0.30  <0.30 ng/mL  TSH      Result Value Ref Range   TSH 0.566  0.350 - 4.500 uIU/mL  BASIC METABOLIC PANEL      Result Value Ref Range   Sodium 139  135 - 145 mEq/L   Potassium 3.5  3.5 - 5.1 mEq/L   Chloride 106  96 - 112 mEq/L   CO2 27  19 - 32 mEq/L   Glucose, Bld 125 (*) 70 - 99 mg/dL   BUN 7  6 - 23 mg/dL   Creatinine, Ser 8.110.87  0.50 - 1.35 mg/dL   Calcium 8.5  8.4 - 91.410.5 mg/dL   GFR calc non Af Amer >90  >90 mL/min   GFR calc Af Amer >90  >90 mL/min  CBC      Result Value Ref Range  WBC 5.9  4.0 - 10.5 K/uL   RBC 4.09 (*) 4.22 - 5.81 MIL/uL   Hemoglobin 12.5 (*) 13.0 - 17.0 g/dL   HCT 83.0 (*) 94.0 - 76.8 %   MCV 88.8  78.0 - 100.0 fL   MCH 30.6  26.0 - 34.0 pg   MCHC 34.4  30.0 - 36.0 g/dL   RDW 08.8  11.0 - 31.5 %   Platelets 142 (*) 150 - 400 K/uL  TROPONIN I      Result Value Ref Range   Troponin I <0.30  <0.30 ng/mL   DIAGNOSTIC STUDIES: Oxygen Saturation is 98% on room air, normal by my interpretation.    COORDINATION OF CARE:  2:03 PM Discussed course of care with pt which includes drug screening . Pt understands and agrees.       Assessment & Plan:  I told the patient I would not charge him for his office visit today. I told him I was not comfortable writing for oxycodone. He has a phone message he received from Dr. Jordan Likes about pain management but did not feel he could afford  that. He did not want to have physical therapy with Dr. Jeanette Caprice because of finances. The patient stated he had not taken any opiates since Monday however he did test positive for opiates on his drug screen in the office today. he is on prescription Xanax. He tested negative for cocaine I told him I could not write for any pain medication. He stated he will go to [facility] to see if that will help him with his situation I suggested he  make the appointment with Dr. Jordan Likes at pain management or see Dr. Yevette Edwards. Since no medications were prescribed through the office. I did not send off his drug screen for confirmation. Assistant Stevphen Rochester was in the room with me during the discussion

## 2014-05-25 NOTE — Telephone Encounter (Signed)
Patient called and wants to speak with Dr. Cleta Alberts personally. Patient states his insurance ran out and he really needs to see Dr. Cleta Alberts. Patient states Dr. Cleta Alberts spoke with him about seeing him once his insurance ran out. Please return call. Thank you.

## 2014-05-25 NOTE — Progress Notes (Deleted)
   Subjective:    Patient ID: Alexis West, male    DOB: March 11, 1976, 38 y.o.   MRN: [id number]  HPI 38 yr old male is here today for medication refill  of Oxycodone HCl 10 mg. Patient has no other complaints.    Review of Systems     Objective:   Physical Exam        Assessment & Plan:

## 2014-05-25 NOTE — Telephone Encounter (Signed)
Patient called and wants to speak with Dr. Cleta Alberts personally. Patient states that Dr. Cleta Alberts mentioned that he would see him once his insurance ran out and that time has come. Please return call and advise. Thank you.

## 2014-05-25 NOTE — Telephone Encounter (Signed)
Spoke to pt- advised on Dr. Ellis Parents hours today and Saturday- pt states he will be in.

## 2014-09-15 ENCOUNTER — Emergency Department (HOSPITAL_COMMUNITY)
Admission: EM | Admit: 2014-09-15 | Discharge: 2014-09-16 | Disposition: A | Discharge status: Home / Self Care | Payer: Self-pay | Attending: Emergency Medicine | Admitting: Emergency Medicine

## 2014-09-15 ENCOUNTER — Encounter (HOSPITAL_COMMUNITY): Payer: Self-pay | Admitting: Emergency Medicine

## 2014-09-15 DIAGNOSIS — F13239 Sedative, hypnotic or anxiolytic dependence with withdrawal, unspecified: Secondary | ICD-10-CM

## 2014-09-15 DIAGNOSIS — I10 Essential (primary) hypertension: Secondary | ICD-10-CM | POA: Insufficient documentation

## 2014-09-15 DIAGNOSIS — F17200 Nicotine dependence, unspecified, uncomplicated: Secondary | ICD-10-CM | POA: Insufficient documentation

## 2014-09-15 DIAGNOSIS — W503XXA Accidental bite by another person, initial encounter: Secondary | ICD-10-CM | POA: Insufficient documentation

## 2014-09-15 DIAGNOSIS — G8929 Other chronic pain: Secondary | ICD-10-CM | POA: Insufficient documentation

## 2014-09-15 DIAGNOSIS — Z880 Allergy status to penicillin: Secondary | ICD-10-CM | POA: Insufficient documentation

## 2014-09-15 DIAGNOSIS — G40309 Generalized idiopathic epilepsy and epileptic syndromes, not intractable, without status epilepticus: Secondary | ICD-10-CM | POA: Insufficient documentation

## 2014-09-15 DIAGNOSIS — Z8659 Personal history of other mental and behavioral disorders: Secondary | ICD-10-CM | POA: Insufficient documentation

## 2014-09-15 DIAGNOSIS — Y9389 Activity, other specified: Secondary | ICD-10-CM | POA: Insufficient documentation

## 2014-09-15 DIAGNOSIS — M549 Dorsalgia, unspecified: Secondary | ICD-10-CM

## 2014-09-15 DIAGNOSIS — F13939 Sedative, hypnotic or anxiolytic use, unspecified with withdrawal, unspecified: Secondary | ICD-10-CM

## 2014-09-15 DIAGNOSIS — IMO0001 Reserved for inherently not codable concepts without codable children: Secondary | ICD-10-CM | POA: Insufficient documentation

## 2014-09-15 DIAGNOSIS — R569 Unspecified convulsions: Secondary | ICD-10-CM | POA: Insufficient documentation

## 2014-09-15 DIAGNOSIS — S01501A Unspecified open wound of lip, initial encounter: Secondary | ICD-10-CM | POA: Insufficient documentation

## 2014-09-15 DIAGNOSIS — F19939 Other psychoactive substance use, unspecified with withdrawal, unspecified: Secondary | ICD-10-CM | POA: Insufficient documentation

## 2014-09-15 DIAGNOSIS — Y9289 Other specified places as the place of occurrence of the external cause: Secondary | ICD-10-CM | POA: Insufficient documentation

## 2014-09-15 DIAGNOSIS — Z79899 Other long term (current) drug therapy: Secondary | ICD-10-CM | POA: Insufficient documentation

## 2014-09-15 LAB — CBC
HCT: 44.8 % (ref 39.0–52.0)
Hemoglobin: 16.2 g/dL (ref 13.0–17.0)
MCH: 30.5 pg (ref 26.0–34.0)
MCHC: 36.2 g/dL — ABNORMAL HIGH (ref 30.0–36.0)
MCV: 84.2 fL (ref 78.0–100.0)
Platelets: 270 10*3/uL (ref 150–400)
RBC: 5.32 MIL/uL (ref 4.22–5.81)
RDW: 12.3 % (ref 11.5–15.5)
WBC: 11.8 10*3/uL — ABNORMAL HIGH (ref 4.0–10.5)

## 2014-09-15 LAB — RAPID URINE DRUG SCREEN, HOSP PERFORMED
Amphetamines: NOT DETECTED
Barbiturates: NOT DETECTED
Benzodiazepines: NOT DETECTED
Cocaine: NOT DETECTED
Opiates: NOT DETECTED
Tetrahydrocannabinol: NOT DETECTED

## 2014-09-15 LAB — BASIC METABOLIC PANEL
Anion gap: 13 (ref 5–15)
BUN: 9 mg/dL (ref 6–23)
CO2: 26 mEq/L (ref 19–32)
CREATININE: 0.87 mg/dL (ref 0.50–1.35)
Calcium: 10.2 mg/dL (ref 8.4–10.5)
Chloride: 102 mEq/L (ref 96–112)
GFR calc Af Amer: >90 mL/min (ref >90)
Glucose, Bld: 82 mg/dL (ref 70–99)
Potassium: 4.2 mEq/L (ref 3.7–5.3)
Sodium: 141 mEq/L (ref 137–147)

## 2014-09-15 LAB — ETHANOL

## 2014-09-15 MED ORDER — ALPRAZOLAM 1 MG PO TABS: ORAL_TABLET | ORAL | Status: DC

## 2014-09-15 MED ORDER — ALPRAZOLAM 0.5 MG PO TABS
1.0000 mg | ORAL_TABLET | Freq: Once | ORAL | Status: AC
  Administered 2014-09-15: 1 mg via ORAL
  Filled 2014-09-15: qty 2

## 2014-09-15 NOTE — ED Provider Notes (Signed)
CSN: [id number]     Arrival date & time 09/15/14  1832 History   First MD Initiated Contact with Patient 09/15/14 2052     Chief Complaint  Patient presents with  . Seizures     (Consider location/radiation/quality/duration/timing/severity/associated sxs/prior Treatment) Patient is a 38 y.o. male presenting with seizures. The history is provided by the patient.  Seizures Seizure activity on arrival: no   Seizure type:  Grand mal  patient with reportedly witnessed 30 second seizure. History same with benzodiazepine withdrawal. States his been off his been so she does need a longer has insurance. States she's been on it for 10 years. States he has been trying to taper it down. Patient states he aches all over. States this is typical after one seizure.  Past Medical History  Diagnosis Date  . Hypertension   . Panic attack   . Chronic back pain   . Seizures     Benzo withdrawal seizure 2004.   Past Surgical History  Procedure Laterality Date  . Video bronchoscopy Bilateral 12/15/2013    Procedure: VIDEO BRONCHOSCOPY WITHOUT FLUORO;  Surgeon: Lupita Leash, MD;  Location: [facility] ENDOSCOPY;  Service: Cardiopulmonary;  Laterality: Bilateral;  . Esophagogastroduodenoscopy N/A 12/21/2013    Procedure: ESOPHAGOGASTRODUODENOSCOPY (EGD);  Surgeon: Theda Belfast, MD;  Location: [facility] ENDOSCOPY;  Service: Endoscopy;  Laterality: N/A;   Family History  Problem Relation Age of Onset  . Hypertension Father    History  Substance Use Topics  . Smoking status: Current Every Day Smoker -- 0.50 packs/day    Types: Cigarettes    Start date: 12/30/1988  . Smokeless tobacco: Never Used     Comment: Pt has smoked on and off since he was 13.  Pt has this time been smoking since 08/2012  . Alcohol Use: No    Review of Systems  Constitutional: Negative for activity change and appetite change.  Eyes: Negative for pain.  Respiratory: Negative for chest tightness and shortness of breath.    Cardiovascular: Negative for chest pain and leg swelling.  Gastrointestinal: Negative for nausea, vomiting, abdominal pain and diarrhea.  Genitourinary: Negative for flank pain.  Musculoskeletal: Positive for myalgias. Negative for back pain and neck stiffness.  Skin: Positive for wound. Negative for rash.  Neurological: Positive for seizures. Negative for weakness, numbness and headaches.  Psychiatric/Behavioral: Negative for behavioral problems.      Allergies  Penicillins and Morphine and related  Home Medications   Prior to Admission medications   Medication Sig Start Date End Date Taking? Authorizing Provider  naproxen sodium (ANAPROX) 220 MG tablet Take 440 mg by mouth as needed (fever).   Yes Historical Provider, MD  Oxycodone HCl 10 MG TABS Take 1 tablet (10 mg total) by mouth every 12 (twelve) hours as needed. 02/09/14  Yes Collene Gobble, MD  traZODone (DESYREL) 100 MG tablet Take 200 mg by mouth at bedtime.   Yes Historical Provider, MD  ALPRAZolam Prudy Feeler) 1 MG tablet 1 tablet bid for 5 days, then 1 tablet q day for 5 days 09/15/14   Juliet Rude. Keirstan Iannello, MD   BP 134/93  Pulse 87  Temp(Src) 98.1 F (36.7 C) (Oral)  Resp 16  SpO2 98% Physical Exam  Nursing note and vitals reviewed. Constitutional: He is oriented to person, place, and time. He appears well-developed and well-nourished.  HENT:  Head: Normocephalic and atraumatic.  Dry blood around mouth bilaterally. Bite to mucosal lip on right side  Eyes: EOM are normal. Pupils are equal,  round, and reactive to light.  Neck: Normal range of motion. Neck supple.  Cardiovascular: Normal rate, regular rhythm and normal heart sounds.   No murmur heard. Pulmonary/Chest: Effort normal and breath sounds normal.  Abdominal: Soft. Bowel sounds are normal. He exhibits no distension and no mass. There is no tenderness. There is no rebound and no guarding.  Musculoskeletal: Normal range of motion. He exhibits no edema.   Neurological: He is alert and oriented to person, place, and time. No cranial nerve deficit.  Skin: Skin is warm and dry.  Psychiatric: He has a normal mood and affect.    ED Course  Procedures (including critical care time) Labs Review Labs Reviewed  CBC - Abnormal; Notable for the following:    WBC 11.8 (*)    MCHC 36.2 (*)    All other components within normal limits  BASIC METABOLIC PANEL  URINE RAPID DRUG SCREEN (HOSP PERFORMED)  ETHANOL    Imaging Review No results found.   EKG Interpretation None      MDM   Final diagnoses:  Seizure  Benzodiazepine withdrawal, with unspecified complication    Patient with seizure. History of same with benzo withdrawal period has been space out his medications. He is at baseline now. Will give short course of benzodiazepines to help stop seizures. He will  need to follow with his PCP    Juliet Rude. Rubin Payor, MD 09/15/14 [phone]

## 2014-09-15 NOTE — ED Notes (Signed)
Per EMS: pt had witnessed 30 sec seizure. Has hx of seizure. Pt at home when happen. Pt denies neck pain but has slight back pain. Pt does not take seizure meds. Pt has small lac to right side of mouth. Pt has hx of substance abuse denies use today. 20 g in right AC. 4 mg Zofran in route.

## 2014-09-15 NOTE — Discharge Instructions (Signed)
Benzodiazepine Withdrawal  °Benzodiazepines are a group of drugs that are prescribed for both short-term and long-term treatment of a variety of medical conditions. For some of these conditions, such as seizures and sudden and severe muscle spasms, they are used only for a few hours or a few days. For other conditions, such as anxiety, sleep problems, or frequent muscle spasms or to help prevent seizures, they are used for an extended period, usually weeks or months. °Benzodiazepines work by changing the way your brain functions. Normally, chemicals in your brain called neurotransmitters send messages between your brain cells. The neurotransmitter that benzodiazepines affect is called gamma-aminobutyric acid (GABA). GABA sends out messages that have a calming effect on many of the functions of your brain. Benzodiazepines make these messages stronger and increase this calming effect. °Short-term use of benzodiazepines usually does not cause problems when you stop taking the drugs. However, if you take benzodiazepines for a long time, your body can adjust to the drug and require more of it to produce the same effect (drug tolerance). Eventually, you can develop physical dependence on benzodiazepines, which is when you experience negative effects if your dosage of benzodiazepines is reduced or stopped too quickly. These negative effects are called symptoms of withdrawal. °SYMPTOMS °Symptoms of withdrawal may begin anytime within the first 10 days after you stop taking the benzodiazepine. They can last from several weeks up to a few months but usually are the worst between the first 10 to 14 days.  °The actual symptoms also vary, depending on the type of benzodiazepine you take. Possible symptoms include: °· Anxiety. °· Excitability. °· Irritability. °· Depression. °· Mood swings. °· Trouble sleeping. °· Confusion. °· Uncontrollable shaking (tremors). °· Muscle weakness. °· Seizures. °DIAGNOSIS °To diagnose  benzodiazepine withdrawal, your caregiver will examine you for certain signs, such as: °· Rapid heartbeat. °· Rapid breathing. °· Tremors. °· High blood pressure. °· Fever. °· Mood changes. °Your caregiver also may ask the following questions about your use of benzodiazepines: °· What type of benzodiazepine did you take? °· How much did you take each day? °· How long did you take the drug? °· When was the last time you took the drug? °· Do you take any other drugs? °· Have you had alcohol recently? °· Have you had a seizure recently? °· Have you lost consciousness recently? °· Have you had trouble remembering recent events? °· Have you had a recent increase in anxiety, irritability, or trouble sleeping? °A drug test also may be administered. °TREATMENT °The treatment for benzodiazepine withdrawal can vary, depending on the type and severity of your symptoms, what type of benzodiazepine you have been taking, and how long you have been taking the benzodiazepine. Sometimes it is necessary for you to be treated in a hospital, especially if you are at risk of seizures.  °Often, treatment includes a prescription for a long-acting benzodiazepine, the dosage of which is reduced slowly over a long period. This period could be several weeks or months. Eventually, your dosage will be reduced to a point that you can stop taking the drug, without experiencing withdrawal symptoms. This is called tapered withdrawal. Occasionally, minor symptoms of withdrawal continue for a few days or weeks after you have completed a tapered withdrawal. °SEEK IMMEDIATE MEDICAL CARE IF: °· You have a seizure. °· You develop a craving for drugs or alcohol. °· You begin to experience symptoms of withdrawal during your tapered withdrawal. °· You become very confused. °· You lose consciousness. °· You   have trouble breathing. °· You think about hurting yourself or someone else. °Document Released: 12/05/2011 Document Revised: 03/09/2012 Document  Reviewed: 12/05/2011 °ExitCare® Patient Information ©2015 [employer]. This information is not intended to replace advice given to you by your health care provider. Make sure you discuss any questions you have with your health care provider. ° °Seizure, Adult °A seizure is abnormal electrical activity in the brain. Seizures usually last from 30 seconds to 2 minutes. There are various types of seizures. °Before a seizure, you may have a warning sensation (aura) that a seizure is about to occur. An aura may include the following symptoms:  °· Fear or anxiety. °· Nausea. °· Feeling like the room is spinning (vertigo). °· Vision changes, such as seeing flashing lights or spots. °Common symptoms during a seizure include: °· A change in attention or behavior (altered mental status). °· Convulsions with rhythmic jerking movements. °· Drooling. °· Rapid eye movements. °· Grunting. °· Loss of bladder and bowel control. °· Bitter taste in the mouth. °· Tongue biting. °After a seizure, you may feel confused and sleepy. You may also have an injury resulting from convulsions during the seizure. °HOME CARE INSTRUCTIONS  °· If you are given medicines, take them exactly as prescribed by your health care provider. °· Keep all follow-up appointments as directed by your health care provider. °· Do not swim or drive or engage in risky activity during which a seizure could cause further injury to you or others until your health care provider says it is OK. °· Get adequate rest. °· Teach friends and family what to do if you have a seizure. They should: °¨ Lay you on the ground to prevent a fall. °¨ Put a cushion under your head. °¨ Loosen any tight clothing around your neck. °¨ Turn you on your side. If vomiting occurs, this helps keep your airway clear. °¨ Stay with you until you recover. °¨ Know whether or not you need emergency care. °SEEK IMMEDIATE MEDICAL CARE IF: °· The seizure lasts longer than 5 minutes. °· The seizure is  severe or you do not wake up immediately after the seizure. °· You have an altered mental status after the seizure. °· You are having more frequent or worsening seizures. °Someone should drive you to the emergency department or call local emergency services (911 in [place] °MAKE SURE YOU: °· Understand these instructions. °· Will watch your condition. °· Will get help right away if you are not doing well or get worse. °Document Released: 12/13/2000 Document Revised: 10/06/2013 Document Reviewed: 07/28/2013 °ExitCare® Patient Information ©2015 [employer]. This information is not intended to replace advice given to you by your health care provider. Make sure you discuss any questions you have with your health care provider. ° °

## 2014-09-15 NOTE — ED Notes (Signed)
Bed: WA23 Expected date:  Expected time:  Means of arrival:  Comments: seizure 

## 2014-09-15 NOTE — ED Notes (Signed)
Pt states he did bite the R inside portion of his mouth, blood around pts mouth, pt a/o x 3.

## 2014-09-16 ENCOUNTER — Encounter (HOSPITAL_COMMUNITY): Payer: Self-pay | Admitting: Emergency Medicine

## 2014-09-16 ENCOUNTER — Emergency Department (HOSPITAL_COMMUNITY)
Admission: EM | Admit: 2014-09-16 | Discharge: 2014-09-16 | Disposition: A | Discharge status: Home / Self Care | Payer: Self-pay | Attending: Emergency Medicine | Admitting: Emergency Medicine

## 2014-09-16 DIAGNOSIS — R4589 Other symptoms and signs involving emotional state: Secondary | ICD-10-CM

## 2014-09-16 DIAGNOSIS — F329 Major depressive disorder, single episode, unspecified: Secondary | ICD-10-CM

## 2014-09-16 DIAGNOSIS — F3289 Other specified depressive episodes: Secondary | ICD-10-CM | POA: Insufficient documentation

## 2014-09-16 DIAGNOSIS — F419 Anxiety disorder, unspecified: Secondary | ICD-10-CM

## 2014-09-16 DIAGNOSIS — F1110 Opioid abuse, uncomplicated: Secondary | ICD-10-CM | POA: Insufficient documentation

## 2014-09-16 DIAGNOSIS — F411 Generalized anxiety disorder: Secondary | ICD-10-CM | POA: Insufficient documentation

## 2014-09-16 DIAGNOSIS — F1310 Sedative, hypnotic or anxiolytic abuse, uncomplicated: Secondary | ICD-10-CM | POA: Insufficient documentation

## 2014-09-16 DIAGNOSIS — R45851 Suicidal ideations: Secondary | ICD-10-CM | POA: Insufficient documentation

## 2014-09-16 DIAGNOSIS — R4689 Other symptoms and signs involving appearance and behavior: Secondary | ICD-10-CM

## 2014-09-16 DIAGNOSIS — Z79899 Other long term (current) drug therapy: Secondary | ICD-10-CM | POA: Insufficient documentation

## 2014-09-16 DIAGNOSIS — F17200 Nicotine dependence, unspecified, uncomplicated: Secondary | ICD-10-CM | POA: Insufficient documentation

## 2014-09-16 DIAGNOSIS — F32A Depression, unspecified: Secondary | ICD-10-CM | POA: Diagnosis present

## 2014-09-16 DIAGNOSIS — Z880 Allergy status to penicillin: Secondary | ICD-10-CM | POA: Insufficient documentation

## 2014-09-16 DIAGNOSIS — I10 Essential (primary) hypertension: Secondary | ICD-10-CM | POA: Insufficient documentation

## 2014-09-16 DIAGNOSIS — Z765 Malingerer [conscious simulation]: Secondary | ICD-10-CM | POA: Diagnosis present

## 2014-09-16 DIAGNOSIS — G8929 Other chronic pain: Secondary | ICD-10-CM | POA: Insufficient documentation

## 2014-09-16 LAB — RAPID URINE DRUG SCREEN, HOSP PERFORMED
AMPHETAMINES: NOT DETECTED
Barbiturates: NOT DETECTED
Benzodiazepines: POSITIVE — AB
Cocaine: NOT DETECTED
OPIATES: POSITIVE — AB
TETRAHYDROCANNABINOL: NOT DETECTED

## 2014-09-16 LAB — COMPREHENSIVE METABOLIC PANEL
ALK PHOS: 73 U/L (ref 39–117)
ALT: 12 U/L (ref 0–53)
AST: 17 U/L (ref 0–37)
Albumin: 4.6 g/dL (ref 3.5–5.2)
Anion gap: 15 (ref 5–15)
BUN: 9 mg/dL (ref 6–23)
CO2: 23 mEq/L (ref 19–32)
Calcium: 10.1 mg/dL (ref 8.4–10.5)
Chloride: 100 mEq/L (ref 96–112)
Creatinine, Ser: 0.91 mg/dL (ref 0.50–1.35)
GFR calc Af Amer: >90 mL/min (ref >90)
GFR calc non Af Amer: >90 mL/min (ref >90)
GLUCOSE: 111 mg/dL — AB (ref 70–99)
POTASSIUM: 3.9 meq/L (ref 3.7–5.3)
SODIUM: 138 meq/L (ref 137–147)
Total Bilirubin: 0.7 mg/dL (ref 0.3–1.2)
Total Protein: 7.7 g/dL (ref 6.0–8.3)

## 2014-09-16 LAB — CBC
HCT: 48.3 % (ref 39.0–52.0)
HEMOGLOBIN: 16.8 g/dL (ref 13.0–17.0)
MCH: 30.2 pg (ref 26.0–34.0)
MCHC: 34.8 g/dL (ref 30.0–36.0)
MCV: 86.9 fL (ref 78.0–100.0)
Platelets: 224 10*3/uL (ref 150–400)
RBC: 5.56 MIL/uL (ref 4.22–5.81)
RDW: 12.5 % (ref 11.5–15.5)
WBC: 9.8 10*3/uL (ref 4.0–10.5)

## 2014-09-16 LAB — ETHANOL: Alcohol, Ethyl (B): <11 mg/dL (ref 0–11)

## 2014-09-16 LAB — SALICYLATE LEVEL: Salicylate Lvl: <2 mg/dL — ABNORMAL LOW (ref 2.8–20.0)

## 2014-09-16 LAB — ACETAMINOPHEN LEVEL

## 2014-09-16 MED ORDER — IBUPROFEN 200 MG PO TABS: 600.0000 mg | ORAL_TABLET | Freq: Three times a day (TID) | ORAL | Status: DC | PRN

## 2014-09-16 MED ORDER — LORAZEPAM 1 MG PO TABS
1.0000 mg | ORAL_TABLET | Freq: Once | ORAL | Status: AC
  Administered 2014-09-16: 1 mg via ORAL
  Filled 2014-09-16: qty 1

## 2014-09-16 MED ORDER — HYDROXYZINE HCL 25 MG PO TABS: 25.0000 mg | ORAL_TABLET | Freq: Four times a day (QID) | ORAL | Status: AC | PRN

## 2014-09-16 MED ORDER — TRAZODONE HCL 100 MG PO TABS: 200.0000 mg | ORAL_TABLET | Freq: Every day | ORAL | Status: AC

## 2014-09-16 MED ORDER — ACETAMINOPHEN 325 MG PO TABS
650.0000 mg | ORAL_TABLET | ORAL | Status: DC | PRN
  Administered 2014-09-16: 650 mg via ORAL
  Filled 2014-09-16: qty 2

## 2014-09-16 MED ORDER — LORAZEPAM 1 MG PO TABS
1.0000 mg | ORAL_TABLET | Freq: Three times a day (TID) | ORAL | Status: DC | PRN
  Administered 2014-09-16: 1 mg via ORAL
  Filled 2014-09-16: qty 1

## 2014-09-16 MED ORDER — ONDANSETRON HCL 4 MG PO TABS: 4.0000 mg | ORAL_TABLET | Freq: Three times a day (TID) | ORAL | Status: DC | PRN

## 2014-09-16 MED ORDER — ALUM & MAG HYDROXIDE-SIMETH 200-200-20 MG/5ML PO SUSP: 30.0000 mL | ORAL | Status: DC | PRN

## 2014-09-16 MED ORDER — HYDROXYZINE HCL 25 MG PO TABS: 25.0000 mg | ORAL_TABLET | Freq: Four times a day (QID) | ORAL | Status: DC | PRN

## 2014-09-16 MED ORDER — ZOLPIDEM TARTRATE 5 MG PO TABS: 5.0000 mg | ORAL_TABLET | Freq: Every evening | ORAL | Status: DC | PRN

## 2014-09-16 MED ORDER — NICOTINE 21 MG/24HR TD PT24
21.0000 mg | MEDICATED_PATCH | Freq: Every day | TRANSDERMAL | Status: DC
  Administered 2014-09-16: 21 mg via TRANSDERMAL
  Filled 2014-09-16: qty 1

## 2014-09-16 NOTE — [facility] Suicide Risk Assessment (Signed)
Suicide Risk Assessment  Discharge Assessment     Demographic Factors:  Male and Caucasian  Total Time spent with patient: 20 minutes   PPsychiatric Specialty Exam:     Blood pressure 92/51, pulse 70, temperature 97.8 F (36.6 C), temperature source Oral, resp. rate 18, SpO2 98.00%.There is no weight on file to calculate BMI.  General Appearance: Casual  Eye Contact::  Good  Speech:  Normal Rate  Volume:  Normal  Mood:  Anxious, depressed  Affect:  Congruent  Thought Process:  Coherent  Orientation:  Full (Time, Place, and Person)  Thought Content:  WDL  Suicidal Thoughts:  Yes, no intent  Homicidal Thoughts:  No  Memory:  Immediate;   Good Recent;   Good Remote;   Good  Judgement:  Fair  Insight:  Fair  Psychomotor Activity:  Normal  Concentration:  Good  Recall:  Good  Fund of Knowledge:Good  Language: Good  Akathisia:  No  Handed:  Right  AIMS (if indicated):     Assets:  Leisure Time Physical Health Resilience  Sleep:      Musculoskeletal: Strength & Muscle Tone: within normal limits Gait & Station: normal Patient leans: N/A  Mental Status Per Nursing Assessment::   On Admission:   Depression, anxiety  Current Mental Status by Physician: NA  Loss Factors: NA  Historical Factors: Impulsivity  Risk Reduction Factors:   Sense of responsibility to family and Positive therapeutic relationship  Continued Clinical Symptoms:  Anxiety, depression  Cognitive Features That Contribute To Risk:  None  Suicide Risk:  Minimal: No identifiable suicidal ideation.  Patients presenting with no risk factors but with morbid ruminations; may be classified as minimal risk based on the severity of the depressive symptoms  Discharge Diagnoses:   AXIS I:  Anxiety Disorder NOS, Depressive Disorder NOS and Substance Abuse AXIS II:  Deferred AXIS III:   Past Medical History  Diagnosis Date  . Hypertension   . Panic attack   . Chronic back pain   . Seizures      Benzo withdrawal seizure 2004.   AXIS IV:  other psychosocial or environmental problems, problems related to legal system/crime and problems related to social environment AXIS V:  61-70 mild symptoms  Plan Of Care/Follow-up recommendations:  Activity:  as tolerated Diet:  low-sodium heart healthy diet  Is patient on multiple antipsychotic therapies at discharge:  No   Has Patient had three or more failed trials of antipsychotic monotherapy by history:  No  Recommended Plan for Multiple Antipsychotic Therapies: NA    LORD, JAMISON, PMH-NP 09/16/2014, 12:18 PM

## 2014-09-16 NOTE — ED Notes (Signed)
Pt presents with c/o suicidal ideation. Pt was just discharged for seizures and said that he is checking back in because he is feeling suicidal. Pt says he tried to hang himself the other day and it was a failed attempt. Pt denies HI. Pt denies hearing voices or seeing things.

## 2014-09-16 NOTE — ED Notes (Signed)
Patient arrived to unit with c/o depression and SI with no specific plans. Pt verbally contracts for safety. Sitter at bedside for safety.

## 2014-09-16 NOTE — Discharge Instructions (Signed)
Depression Depression is feeling sad, low, down in the dumps, blue, gloomy, or empty. In general, there are two kinds of depression:  Normal sadness or grief. This can happen after something upsetting. It often goes away on its own within 2 weeks. After losing a loved one (bereavement), normal sadness and grief may last longer than two weeks. It usually gets better with time.  Clinical depression. This kind lasts longer than normal sadness or grief. It keeps you from doing the things you normally do in life. It is often hard to function at home, work, or at school. It may affect your relationships with others. Treatment is often needed. GET HELP RIGHT AWAY IF:  You have thoughts about hurting yourself or others.  You lose touch with reality (psychotic symptoms). You may:  See or hear things that are not real.  Have untrue beliefs about your life or people around you.  Your medicine is giving you problems. MAKE SURE YOU:  Understand these instructions.  Will watch your condition.  Will get help right away if you are not doing well or get worse. Document Released: 01/18/2011 Document Revised: 05/02/2014 Document Reviewed: 04/16/2012 ExitCare Patient Information 2015 [employer]. This information is not intended to replace advice given to you by your health care provider. Make sure you discuss any questions you have with your health care provider.  

## 2014-09-16 NOTE — ED Notes (Signed)
Pt given sandwich and drink.

## 2014-09-16 NOTE — ED Provider Notes (Signed)
Medical screening examination/treatment/procedure(s) were performed by non-physician practitioner and as supervising physician I was immediately available for consultation/collaboration.   EKG Interpretation None       Sulayman Manning, MD 09/16/14 0818 

## 2014-09-16 NOTE — ED Notes (Signed)
Pt reports that he ran out of his pain medication and was going to run out of insurance. He denies si at present time and says that he would try to hurt himself if not in the hospital. He denies hi. Pt rates depression as an 8 and hopelessness as a 9 on 1-10 scale with 10 being the most.

## 2014-09-16 NOTE — ED Provider Notes (Signed)
CSN: [id number]     Arrival date & time 09/16/14  0058 History   First MD Initiated Contact with Patient 09/16/14 0251     Chief Complaint  Patient presents with  . Suicidal     (Consider location/radiation/quality/duration/timing/severity/associated sxs/prior Treatment) HPI  Patient to the ER with complaints of suicidal ideation and reports that he tried to hang himself the other day. The rope broke. He has been feeling suicidal for the past few months. He has run out of Trazadone and Xanax because he has been unable to afford them. He was laid off a couple of months ago and is having family problems. He denies alcohol abuse. Denies drug abuse. No HI. Denies delusions or hallucinations.  Past Medical History  Diagnosis Date  . Hypertension   . Panic attack   . Chronic back pain   . Seizures     Benzo withdrawal seizure 2004.   Past Surgical History  Procedure Laterality Date  . Video bronchoscopy Bilateral 12/15/2013    Procedure: VIDEO BRONCHOSCOPY WITHOUT FLUORO;  Surgeon: Lupita Leash, MD;  Location: [facility] ENDOSCOPY;  Service: Cardiopulmonary;  Laterality: Bilateral;  . Esophagogastroduodenoscopy N/A 12/21/2013    Procedure: ESOPHAGOGASTRODUODENOSCOPY (EGD);  Surgeon: Theda Belfast, MD;  Location: [facility] ENDOSCOPY;  Service: Endoscopy;  Laterality: N/A;   Family History  Problem Relation Age of Onset  . Hypertension Father    History  Substance Use Topics  . Smoking status: Current Every Day Smoker -- 0.50 packs/day    Types: Cigarettes    Start date: 12/30/1988  . Smokeless tobacco: Never Used     Comment: Pt has smoked on and off since he was 13.  Pt has this time been smoking since 08/2012  . Alcohol Use: No    Review of Systems   Review of Systems  Gen: no weight loss, fevers, chills, night sweats  Eyes: no occular draining, occular pain,  No visual changes  Nose: no epistaxis or rhinorrhea  Mouth: no dental pain, no sore throat  Neck: no neck pain  Lungs:  No hemoptysis. No wheezing or coughing CV:  No palpitations, dependent edema or orthopnea. No chest pain Abd: no diarrhea. No nausea or vomiting, No abdominal pain  GU: no dysuria or gross hematuria  MSK:  No muscle weakness, No muscular pain Neuro: no headache, no focal neurologic deficits  Skin: no rash , no wounds Psyche: + depression and suicidal ideation    Allergies  Penicillins and Morphine and related  Home Medications   Prior to Admission medications   Medication Sig Start Date End Date Taking? Authorizing Provider  ALPRAZolam Prudy Feeler) 1 MG tablet 1 tablet bid for 5 days, then 1 tablet q day for 5 days 09/15/14   Juliet Rude. Pickering, MD  naproxen sodium (ANAPROX) 220 MG tablet Take 440 mg by mouth as needed (fever).    Historical Provider, MD  Oxycodone HCl 10 MG TABS Take 1 tablet (10 mg total) by mouth every 12 (twelve) hours as needed. 02/09/14   Collene Gobble, MD  traZODone (DESYREL) 100 MG tablet Take 200 mg by mouth at bedtime.    Historical Provider, MD   BP 116/76  Pulse 90  Temp(Src) 98.8 F (37.1 C) (Oral)  Resp 20  SpO2 98% Physical Exam  Nursing note and vitals reviewed. Constitutional: He appears well-developed and well-nourished. No distress.  HENT:  Head: Normocephalic and atraumatic.  Eyes: Pupils are equal, round, and reactive to light.  Neck: Normal range of motion.  Neck supple.  Cardiovascular: Normal rate and regular rhythm.   Pulmonary/Chest: Effort normal.  Abdominal: Soft.  Neurological: He is alert.  Skin: Skin is warm and dry.  Psychiatric: His speech is normal and behavior is normal. His mood appears anxious. He exhibits a depressed mood. He expresses suicidal ideation. He expresses no homicidal ideation. He expresses suicidal plans. He expresses no homicidal plans.    ED Course  Procedures (including critical care time) Labs Review Labs Reviewed  COMPREHENSIVE METABOLIC PANEL - Abnormal; Notable for the following:    Glucose, Bld 111  (*)    All other components within normal limits  SALICYLATE LEVEL - Abnormal; Notable for the following:    Salicylate Lvl <2.0 (*)    All other components within normal limits  URINE RAPID DRUG SCREEN (HOSP PERFORMED) - Abnormal; Notable for the following:    Opiates POSITIVE (*)    Benzodiazepines POSITIVE (*)    All other components within normal limits  ACETAMINOPHEN LEVEL  CBC  ETHANOL    Imaging Review No results found.   EKG Interpretation None      MDM   Final diagnoses:  Suicidal behavior  Anxiety  Depression    Medically cleared per labwork Psych holding orders placed Home meds reviewed. TTS consult placed.  Filed Vitals:   09/16/14 0112  BP: 116/76  Pulse: 90  Temp: 98.8 F (37.1 C)  Resp: [address]       Dorthula Matas, PA-C 09/16/14 [phone]

## 2014-09-16 NOTE — Consult Note (Signed)
[facility] Face-to-Face Psychiatry Consult   Reason for Consult:  Depression Referring Physician:  EDP  Alexis West is an 38 y.o. male. Total Time spent with patient: 20 minutes  Assessment: AXIS I:  Anxiety Disorder NOS and Depressive Disorder NOS AXIS II:  Deferred AXIS III:   Past Medical History  Diagnosis Date  . Hypertension   . Panic attack   . Chronic back pain   . Seizures     Benzo withdrawal seizure 2004.   AXIS IV:  problems related to legal system/crime AXIS V:  61-70 mild symptoms  Plan:  No evidence of imminent risk to self or others at present.  Dr. Darleene Cleaver assessed the patient and concurs with the plan.  Subjective:   Alexis West is a 38 y.o. male patient does not warrant admission. HPI:  The patient "ran into to ED".  He has a warrant for his admission.  Bashar stated he was depressed with suicidal ideations.  He and his sister had a "falling out" and he is presently staying with different people.  He is a patient of Dr. Dayle Points for anxiety.  He was taking 8 mg daily of Xanax prior to Dr. Candis Schatz but he cut him down to 3 mg daily.  He ran our over a week ago and has not had any since then until he came to the ED and they gave him Xanax and a Rx.  Due to his warrant and suicidal watch in the jail, he will be discharged to the police. HPI Elements:   Location:  generalized. Quality:  acute. Severity:  mild. Timing:  intermittent. Duration:  few days. Context:  stressors.  Past Psychiatric History: Past Medical History  Diagnosis Date  . Hypertension   . Panic attack   . Chronic back pain   . Seizures     Benzo withdrawal seizure 2004.    reports that he has been smoking Cigarettes.  He started smoking about 25 years ago. He has been smoking about 0.50 packs per day. He has never used smokeless tobacco. He reports that he uses illicit drugs (Cocaine). He reports that he does not drink alcohol. Family History  Problem Relation Age of Onset  .  Hypertension Father            Allergies:   Allergies  Allergen Reactions  . Penicillins     Nausea, vomiting, nose bleeds  . Morphine And Related Rash    ACT Assessment Complete:  Yes:    Educational Status    Risk to Self: Risk to self with the past 6 months Is patient at risk for suicide?: Yes Substance abuse history and/or treatment for substance abuse?: Yes  Risk to Others:    Abuse:    Prior Inpatient Therapy:    Prior Outpatient Therapy:    Additional Information:                    Objective: Blood pressure 92/51, pulse 70, temperature 97.8 F (36.6 C), temperature source Oral, resp. rate 18, SpO2 98.00%.There is no weight on file to calculate BMI. Results for orders placed during the hospital encounter of 09/16/14 (from the past 72 hour(s))  URINE RAPID DRUG SCREEN (HOSP PERFORMED)     Status: Abnormal   Collection Time    09/16/14  1:33 AM      Result Value Ref Range   Opiates POSITIVE (*) NONE DETECTED   Comment: DELTA CHECK NOTED   Cocaine NONE DETECTED  NONE  DETECTED   Benzodiazepines POSITIVE (*) NONE DETECTED   Comment: DELTA CHECK NOTED   Amphetamines NONE DETECTED  NONE DETECTED   Tetrahydrocannabinol NONE DETECTED  NONE DETECTED   Barbiturates NONE DETECTED  NONE DETECTED   Comment:            DRUG SCREEN FOR MEDICAL PURPOSES     ONLY.  IF CONFIRMATION IS NEEDED     FOR ANY PURPOSE, NOTIFY LAB     WITHIN 5 DAYS.                LOWEST DETECTABLE LIMITS     FOR URINE DRUG SCREEN     Drug Class       Cutoff (ng/mL)     Amphetamine      1000     Barbiturate      200     Benzodiazepine   992     Tricyclics       426     Opiates          300     Cocaine          300     THC              50  ACETAMINOPHEN LEVEL     Status: None   Collection Time    09/16/14  1:39 AM      Result Value Ref Range   Acetaminophen (Tylenol), Serum <15.0  10 - 30 ug/mL   Comment:            THERAPEUTIC CONCENTRATIONS VARY     SIGNIFICANTLY. A RANGE  OF 10-30     ug/mL MAY BE AN EFFECTIVE     CONCENTRATION FOR MANY PATIENTS.     HOWEVER, SOME ARE BEST TREATED     AT CONCENTRATIONS OUTSIDE THIS     RANGE.     ACETAMINOPHEN CONCENTRATIONS     >150 ug/mL AT 4 HOURS AFTER     INGESTION AND >50 ug/mL AT 12     HOURS AFTER INGESTION ARE     OFTEN ASSOCIATED WITH TOXIC     REACTIONS.  CBC     Status: None   Collection Time    09/16/14  1:39 AM      Result Value Ref Range   WBC 9.8  4.0 - 10.5 K/uL   RBC 5.56  4.22 - 5.81 MIL/uL   Hemoglobin 16.8  13.0 - 17.0 g/dL   HCT 48.3  39.0 - 52.0 %   MCV 86.9  78.0 - 100.0 fL   MCH 30.2  26.0 - 34.0 pg   MCHC 34.8  30.0 - 36.0 g/dL   RDW 12.5  11.5 - 15.5 %   Platelets 224  150 - 400 K/uL  COMPREHENSIVE METABOLIC PANEL     Status: Abnormal   Collection Time    09/16/14  1:39 AM      Result Value Ref Range   Sodium 138  137 - 147 mEq/L   Potassium 3.9  3.7 - 5.3 mEq/L   Chloride 100  96 - 112 mEq/L   CO2 23  19 - 32 mEq/L   Glucose, Bld 111 (*) 70 - 99 mg/dL   BUN 9  6 - 23 mg/dL   Creatinine, Ser 0.91  0.50 - 1.35 mg/dL   Calcium 10.1  8.4 - 10.5 mg/dL   Total Protein 7.7  6.0 - 8.3 g/dL   Albumin 4.6  3.5 - 5.2 g/dL  AST 17  0 - 37 U/L   ALT 12  0 - 53 U/L   Alkaline Phosphatase 73  39 - 117 U/L   Total Bilirubin 0.7  0.3 - 1.2 mg/dL   GFR calc non Af Amer >90  >90 mL/min   GFR calc Af Amer >90  >90 mL/min   Comment: (NOTE)     The eGFR has been calculated using the CKD EPI equation.     This calculation has not been validated in all clinical situations.     eGFR's persistently <90 mL/min signify possible Chronic Kidney     Disease.   Anion gap 15  5 - 15  ETHANOL     Status: None   Collection Time    09/16/14  1:39 AM      Result Value Ref Range   Alcohol, Ethyl (B) <11  0 - 11 mg/dL   Comment:            LOWEST DETECTABLE LIMIT FOR     SERUM ALCOHOL IS 11 mg/dL     FOR MEDICAL PURPOSES ONLY  SALICYLATE LEVEL     Status: Abnormal   Collection Time    09/16/14  1:39  AM      Result Value Ref Range   Salicylate Lvl <6.5 (*) 2.8 - 20.0 mg/dL   Labs are reviewed and are pertinent for no medical issues.  Current Facility-Administered Medications  Medication Dose Route Frequency Provider Last Rate Last Dose  . acetaminophen (TYLENOL) tablet 650 mg  650 mg Oral Q4H PRN Linus Mako, PA-C   650 mg at 09/16/14 7846  . alum & mag hydroxide-simeth (MAALOX/MYLANTA) 200-200-20 MG/5ML suspension 30 mL  30 mL Oral PRN Linus Mako, PA-C      . hydrOXYzine (ATARAX/VISTARIL) tablet 25 mg  25 mg Oral Q6H PRN Waylan Boga, NP      . ibuprofen (ADVIL,MOTRIN) tablet 600 mg  600 mg Oral Q8H PRN Linus Mako, PA-C      . nicotine (NICODERM CQ - dosed in mg/24 hours) patch 21 mg  21 mg Transdermal Daily Linus Mako, PA-C   21 mg at 09/16/14 0935  . ondansetron (ZOFRAN) tablet 4 mg  4 mg Oral Q8H PRN Linus Mako, PA-C      . ondansetron (ZOFRAN-ODT) disintegrating tablet 16 mg  16 mg Oral Once Jessica C Copland, MD      . zolpidem (AMBIEN) tablet 5 mg  5 mg Oral QHS PRN Linus Mako, PA-C       Current Outpatient Prescriptions  Medication Sig Dispense Refill  . ALPRAZolam (XANAX) 1 MG tablet Take 1 mg by mouth 3 (three) times daily as needed for anxiety.      . Oxycodone HCl 10 MG TABS Take 1 tablet (10 mg total) by mouth every 12 (twelve) hours as needed.  28 tablet  0  . traZODone (DESYREL) 100 MG tablet Take 200 mg by mouth at bedtime.        Psychiatric Specialty Exam:     Blood pressure 92/51, pulse 70, temperature 97.8 F (36.6 C), temperature source Oral, resp. rate 18, SpO2 98.00%.There is no weight on file to calculate BMI.  General Appearance: Casual  Eye Contact::  Good  Speech:  Normal Rate  Volume:  Normal  Mood:  Anxious, depressed  Affect:  Congruent  Thought Process:  Coherent  Orientation:  Full (Time, Place, and Person)  Thought Content:  WDL  Suicidal Thoughts:  Yes, no intent  Homicidal Thoughts:  No  Memory:   Immediate;   Good Recent;   Good Remote;   Good  Judgement:  Fair  Insight:  Fair  Psychomotor Activity:  Normal  Concentration:  Good  Recall:  Good  Fund of Knowledge:Good  Language: Good  Akathisia:  No  Handed:  Right  AIMS (if indicated):     Assets:  Leisure Time Physical Health Resilience  Sleep:      Musculoskeletal: Strength & Muscle Tone: within normal limits Gait & Station: normal Patient leans: N/A  Treatment Plan Summary: Discharge to the police and follow-up with his regular provider, Dr. Lorra Hals, Theodoro Clock, [place] 09/16/2014 12:11 PM  Patient seen, evaluated and I agree with notes by Nurse Practitioner. Corena Pilgrim, MD

## 2014-09-16 NOTE — ED Notes (Signed)
Pt d/c from the hospital with the police. All items returned. D/C instructions given and prescription given.

## 2014-09-16 NOTE — ED Notes (Signed)
Pt has been wanded by security. 

## 2015-04-23 NOTE — Consult Note (Signed)
PATIENT NAME:  Alexis West, [name] MR#:  [id number] DATE OF BIRTH:  17-Jan-1976  DATE OF CONSULTATION:  03/16/2012  REFERRING PHYSICIAN:  Jolanta B. Jennet MaduroPucilowska, MD CONSULTING PHYSICIAN:  Marin Wisner H. Allena KatzPatel, MD  PRIMARY CARE PHYSICIAN: Rhona LeavensJames F. Burnett ShengHedrick, MD   REASON FOR CONSULTATION: Cough, left earache associated with bleeding as well as dizziness and syncope.   HISTORY OF PRESENT ILLNESS: The patient is a 39 year old Caucasian male who is admitted to Behavioral Medicine for Xanax abuse, who has been having difficulty with earache for the past four weeks. The patient reports that his fiancee has children, and they all had cough and earaches. He started to have earache in both ears, was treated with oral antibiotics. Was seen in the ED as well as seen by his primary M.D. and has been given oral antibiotics. He reports that two weeks ago he started noticing bleeding from his left ear. He was started on ear drops and then the bleeding stopped until yesterday when he started to have rebleeding. He has not been on his ear drops since yesterday. He is also complaining of having fevers, as high as 100.4. He has been afebrile here. The patient also reports that he has been having a nonproductive cough ongoing for the past 2 to 3 weeks. He also reports that when he has these episodes of bleeding, he gets very dizzy, and he had an episode of passing out about two weeks ago.  He otherwise denies any chest pain, shortness of breath. No abdominal pain. No nausea, vomiting, diarrhea. He denies any ringing in the ear or difficulty with hearing.   PAST MEDICAL HISTORY: History of borderline elevated blood pressure which has not been treated. It is being monitored.   PAST SURGICAL HISTORY: Denies any surgical history.  ALLERGIES: None.   MEDICATIONS: He is on Tylenol 650 mg every four hours p.r.n. pain, antacid p.r.n., clonidine 0.1 mg q.6 p.r.n. for withdrawal symptoms, Flexeril 10 mg q.i.d. p.r.n. for muscle spasms,  milk of magnesia 30 mL at bedtime for constipation, trazodone 100 mg at bedtime, benzonatate 100 mg q.6 p.r.n. for cough.   SOCIAL HISTORY: He denies any smoking or alcohol use. Does report Xanax abuse.   FAMILY HISTORY: Positive for hypertension.  REVIEW OF SYSTEMS:  CONSTITUTIONAL: Complains of subjective fevers. Complains of weakness and fatigue. No weight gain. No weight loss.   HEENT: Denies any blurred vision. No double vision. No erythema involving his eyes. There is no nasal drainage. No congestion. Denies any seasonal allergies. Denies any epistaxis. Left ear complaints as above. Denies any difficulty with swallowing.   CARDIOVASCULAR: Denies any chest pain. No orthopnea. No dyspnea on exertion. No syncope. Describes borderline hypertension.   PULMONARY: Complains of nonproductive cough. Was wheezing prior to hospitalization, none now. Denies any hemoptysis. Denies any pneumonia. No chronic obstructive pulmonary disease.   GASTROINTESTINAL: No nausea, vomiting, diarrhea. No abdominal pain. No hematemesis. No hematochezia. No changes in bowel habits.   GENITOURINARY: Denies any dysuria, hematuria, or frequency.  ENDOCRINOLOGY: Denies any polyuria or nocturia.   SKIN: Denies any skin rash.  JOINTS: Denies any joint swelling, erythema, or gout.   NEURO: Denies any numbness, cerebrovascular accident, or seizure disorder.   PSYCHIATRIC: Denies any suicidal ideation. No depression. No bipolar or schizophrenia.   PHYSICAL EXAMINATION: VITAL SIGNS: Temperature 96.7, pulse 88, respirations 20, blood pressure 143/76, oximetry 95%.   GENERAL: The patient is 39 year old male, well developed, well nourished in no acute distress.   HEENT: Head atraumatic, normocephalic.  Pupils equally round, reactive to light and accommodation. Extraocular movements intact. No conjunctival pallor. No scleral icterus.   OROPHARYNX: Clear without any exudate.  NASAL: Exam shows no drainage.  EAR:  Exam shows left ear with bulging tympanic membrane. There is no evidence of perforation. No active bleeding noted. Right ear exam shows normal tympanic membrane.   NECK: No thyromegaly. No carotid bruits.   CARDIOVASCULAR: Regular rate and rhythm. No murmurs, rubs, clicks, or gallops. PMI is not displaced.   LUNGS: Clear to auscultation bilaterally without any rales, rhonchi, or wheezing.   ABDOMEN: Soft, nontender, nondistended. Positive bowel sounds x4. There is no hepatosplenomegaly.   EXTREMITIES: No clubbing, cyanosis, or edema.   SKIN: He has multiple tattoos on his arms. No other rash.   LYMPHATICS: No lymph nodes palpable.   VASCULAR: Good DP and PT pulses.   NEUROLOGICAL: Awake, alert, oriented x3. No focal deficits.   PSYCHIATRIC: Not anxious or depressed.   VASCULAR: Good DP and PT pulses.  LABORATORY, DIAGNOSTIC AND RADIOLOGICAL DATA: His BMP done on March 15, 2012: Glucose was 86, BUN 9, creatinine 1.10, sodium was 142, potassium 4.0. The rest of his BMP was negative. LFTs showed a slightly elevated AST otherwise LFT was normal. TSH was 0.68. WBC 7.7, hemoglobin 17.8, platelet count 239,000.   ASSESSMENT AND PLAN: The patient is a 39 year old white male with history of borderline elevated blood pressure who is admitted to psychiatry for Xanax abuse. Has been having earache for the past few weeks, also has been having bleeding from left ear as well as coughing.   1. Left earache associated with bleeding. Physical examination consistent with otitis media. There is no evidence of perforated eardrum on exam. At this time, will start him on ear drops with antibiotics. ENT is to see him later today. Further recommendations per ENT. 2. Possible acute bronchitis. Will treated him with p.o. Levaquin. Follow his symptoms.  3. Episode of syncope with earache, likely related to otitis media. At this point would monitor his symptoms. If he continues to have these symptoms with treatment  and then consider further work-up for syncope.   TIME: 35 minutes.     ____________________________ Lacie Scotts Allena Katz, MD shp:kma D: 03/16/2012 17:17:22 ET T: 03/17/2012 08:36:51 ET JOB#: [id number]  cc: Leilah Polimeni H. Allena Katz, MD, <Dictator> Rhona Leavens. Burnett Sheng, MD Charise Carwin MD ELECTRONICALLY SIGNED 03/30/2012 13:22

## 2015-04-23 NOTE — H&P (Signed)
PATIENT NAME:  Alexis West, Alexis West MR#:  [id number] DATE OF BIRTH:  1976-01-08  DATE OF ADMISSION:  03/15/2012  REFERRING PHYSICIAN: Daryel NovemberJonathan Williams, MD   ATTENDING PHYSICIAN: Kristine LineaJolanta Pucilowska, MD    IDENTIFYING DATA: Alexis West is a 39 year old male with no past psychiatric history except for benzodiazepine abuse.   CHIEF COMPLAINT: "I want off this stuff".  HISTORY OF PRESENT ILLNESS: Alexis West has been abusing Xanax for many years. Several years ago when he was taking up to 30 pills of Xanax he stopped cold [place] and suffered seizures. He was never hospitalized. He was able to cut back on his Xanax consumption and was able to take fewer pills each day, maybe 2 or 3, but at times he would still take 20. He told me that he is about to get married in four weeks and decided to come to the hospital for detox. Initially he came to the Emergency Room complaining of bleeding from his ear that is accompanied by dizziness and falls. When he disclosed the amount of benzos he's been taking, he was offered benzodiazepine detox. In addition, his family presented a slightly different story such as saying that the patient has been abusing Xanax heavily, that there was an argument with his fiancee during which the patient threatened suicide. He denies symptoms of depression. He denies even symptoms of anxiety and takes Xanax purely for fun. He denies psychotic symptoms or symptoms suggestive of bipolar mania. He does not drink alcohol, use illicit substances or other prescription pills.   PAST PSYCHIATRIC HISTORY: He started as a patient of Dr. Carron BrazenNiemeyer's. This is how he got hooked up on Xanax. He then was seen by Dr. Marguerite OleaMoffett who prescribes clonazepam but the patient still has prescriptions. He does not like Klonopin. He has never been hospitalized. He denies suicide attempts. There were no substance abuse treatment attempts. His original plan was to see some doctor in the community who would detox him from  Xanax in three days. He was surprised to be admitted to Psychiatry here.   FAMILY PSYCHIATRIC HISTORY: His mother, who is deceased, used to suffer depression and anxiety and was taking all sorts of pills.   PAST MEDICAL HISTORY: History of benzodiazepine withdrawal seizures.   ALLERGIES: No known drug allergies.   MEDICATIONS ON ADMISSION: None prescribed.   SOCIAL HISTORY: He lives with his fiancee, about to be married. He used to work as a [employer]or but has not been employed in five years. More recently he had briefly a job and is receiving unemployment now.   REVIEW OF SYSTEMS: CONSTITUTIONAL: No fevers or chills. No weight changes. EYES: No double or blurred vision. ENT: No hearing loss but complains of bleeding from one ear for about two weeks that is accompanied by dizziness and fainting or falls. RESPIRATORY: Positive for cough and bronchitis of four weeks duration for which he was prescribed antibiotics. CARDIOVASCULAR: No chest pain or orthopnea. GASTROINTESTINAL: No abdominal pain, nausea, vomiting, or diarrhea. GU: No incontinence or frequency. ENDOCRINE: No heat or cold intolerance. LYMPHATIC: No anemia or easy bruising. INTEGUMENTARY: No acne or rash. MUSCULOSKELETAL: No muscle or joint pain. NEUROLOGIC: No tingling or weakness. Positive for this slight dizziness. PSYCHIATRIC: See history of present illness for details.   PHYSICAL EXAMINATION:   VITAL SIGNS: Blood pressure 112/76, pulse 85, respirations 20, temperature 96.   GENERAL: This is a well developed male in no acute distress.   HEENT: The pupils are equal, round, and reactive to light.  NECK: Supple. No thyromegaly.   LUNGS: Clear to auscultation. No dullness to percussion.   HEART: Regular rhythm and rate. No murmurs, rubs, or gallops.   ABDOMEN: Soft, nontender, nondistended. Positive bowel sounds.   MUSCULOSKELETAL: Normal muscle strength in all extremities.   SKIN: Multiple tattoos all over his torso and  arms.   LYMPHATIC: No cervical adenopathy.   NEUROLOGIC: Cranial nerves II through XII are intact.   LABORATORY DATA: Chemistries are within normal limits. Blood alcohol level 0. LFTs within normal limits except for AST of 68. TSH 0.68. Urine tox screen positive for benzodiazepines. CBC within normal limits. Urinalysis is not suggestive of urinary tract infection. Serum acetaminophen less than 2. Serum salicylates 3.7.   MENTAL STATUS EXAMINATION ON ADMISSION: The patient is alert and oriented to person, place, time, and situation. He is pleasant, polite, and cooperative. He is in bed wearing hospital scrubs. He maintains good eye contact. His speech is soft. Mood is anxious with full affect. Thought processing is logical and goal oriented. Thought content he denies suicidal or homicidal ideation but was admitted for voicing suicidal threats while arguing with his girlfriend. Perception there are no auditory or visual hallucinations. His cognition is grossly intact. He registers 3 out of 3 and recalls 3 out of 3 objects after five minutes. He can spell world forward and backward. He knows the current president. His insight and judgment are questionable.   SUICIDE RISK ASSESSMENT ON ADMISSION: This is a patient with a long history of severe benzodiazepine abuse who became upset and threatened suicide while arguing with his fiancee.   ASSESSMENT:  AXIS I:  1. Mood disorder, not otherwise specified.  2. Benzodiazepine dependence.   AXIS II: Deferred.   AXIS III:  1. Bronchitis.  2. Bleeding from his ear.   AXIS IV: Substance abuse, employment, financial, relationship.   AXIS V: GAF on admission 25.   PLAN: The patient was admitted to [facility] for safety, stabilization, and medication management. He was initially placed on suicide precautions and was closely monitored for any unsafe behaviors. He underwent full psychiatric and risk assessment.  He received pharmacotherapy, individual and group psychotherapy, substance abuse counseling, and support from therapeutic milieu.  1. Suicidality. This has resolved. The patient is able to contract for safety.  2. Benzodiazepine detox. The patient was not placed on CIWA protocol by Dr. Guss Bunde who admitted this patient. His vital signs are stable. He denies any symptoms of withdrawal. Will continue to monitor.  3. Substance abuse treatment. He is not interested in residential rehab.  4. Disposition. He will be discharged to home.   ____________________________ Ellin Goodie. Jennet Maduro, MD [id number] D: 03/16/2012 19:41:18 ET T: 03/17/2012 05:46:25 ET JOB#: [id number]  cc: Jolanta B. Jennet Maduro, MD, <Dictator> Shari Prows MD ELECTRONICALLY SIGNED 03/17/2012 8:37

## 2015-04-23 NOTE — Consult Note (Signed)
PATIENT NAME:  Alexis West, Alexis West MR#:  [id number] DATE OF BIRTH:  19-Jun-1976  DATE OF CONSULTATION:  03/16/2012  REFERRING PHYSICIAN:  Auburn BilberryShreyang Patel, MD  REASON FOR CONSULTATION: Bleeding left ear and passing out.   HISTORY OF PRESENT ILLNESS: The patient is a 39 year old white male who was admitted to this hospital yesterday because of suicidal ideation and drug abuse. He complained of bleeding from his left ear. He had a significant amount, it stopped spontaneously. He did not complain of change in his hearing although he feels like he has got soreness on the left side of his face. He had another episode earlier today where he had some bleeding in his left ear. He sat down and apparently passed out but has not complained of any more problems from his left ear since then, he cleaned it up as a little bit sore in his neck.   He had infection apparently in his ear several weeks ago. He was put on some amoxicillin and an ear drop that  it seemed to take care of it and after week his symptoms went away and he quit using them. Now he feels like he needs an ear drop again to take care of this problem.   PAST MEDICAL HISTORY: Significant for being fairly healthy overall. He has had trauma to his leg and repair. He recently has had some bronchitis, has been treated with p.o. Levaquin.   SOCIAL HISTORY: He denies smoking, he rarely drinks alcohol. He has history of substance abuse.   PHYSICAL EXAMINATION:  The right ear canal was clear. TMs intact, benign. Left ear canal has a little bit of blood staining inferiorly in the outer portion of the external canal. I do not see any blood staining medially. The eardrum was intact and benign, a little dull, cannot tell if there could be a little bit of fluid behind it, there is certainly no redness or signs of acute infection and no signs of bleeding coming from the eardrum at all. He has slight tenderness of the soft tissues in front of the earlobe and underneath  it. He has a scratch of the skin there but no sign of significant infection or any swelling of the parotid gland. No swollen lymph nodes here. The rest of his neck is negative for any nodes or masses. His oropharynx is clear. He has no sign of any lesions in his mouth. No swelling. The nose is open and clear bilaterally.   IMPRESSION: He has had some bleeding from his ear canal. He states he had rupture eardrum before, but if so it is completely healed now. The only possible bleeding site I see currently is from a scratch beneath his earlobe on his skin or possibly a small scratch inside the external ear canal inferiorly. He does not need any ear drops and if he has any further bleeding I would like to see this again, right now I do not think he needs anything for his ear. He can use some ibuprofen for pain if that is okay  with his other physicians. He should come to the office upon discharge so that he can be evaluated further for the ear and audiogram obtained at that time to make sure there is no middle ear disease.     ____________________________ Cammy CopaPaul H. Kinsly Hild, MD phj:ljs D: 03/16/2012 19:55:32 ET T: 03/17/2012 09:49:51 ET JOB#: [id number]  cc: Cammy CopaPaul H. Dallyn Bergland, MD, <Dictator> Shreyang H. Allena KatzPatel, MD Cammy CopaPAUL H Nyle Limb MD ELECTRONICALLY SIGNED 03/19/2012 9:08

## 2015-10-03 ENCOUNTER — Encounter: Payer: Self-pay | Admitting: Emergency Medicine

## 2016-05-22 ENCOUNTER — Emergency Department
Admission: EM | Admit: 2016-05-22 | Discharge: 2016-05-22 | Disposition: A | Discharge status: Home / Self Care | Payer: Self-pay | Attending: Emergency Medicine | Admitting: Emergency Medicine

## 2016-05-22 ENCOUNTER — Encounter: Payer: Self-pay | Admitting: Emergency Medicine

## 2016-05-22 DIAGNOSIS — Z8669 Personal history of other diseases of the nervous system and sense organs: Secondary | ICD-10-CM | POA: Insufficient documentation

## 2016-05-22 DIAGNOSIS — R197 Diarrhea, unspecified: Secondary | ICD-10-CM

## 2016-05-22 DIAGNOSIS — F17210 Nicotine dependence, cigarettes, uncomplicated: Secondary | ICD-10-CM | POA: Insufficient documentation

## 2016-05-22 DIAGNOSIS — N390 Urinary tract infection, site not specified: Secondary | ICD-10-CM | POA: Insufficient documentation

## 2016-05-22 DIAGNOSIS — R55 Syncope and collapse: Secondary | ICD-10-CM | POA: Insufficient documentation

## 2016-05-22 DIAGNOSIS — F1490 Cocaine use, unspecified, uncomplicated: Secondary | ICD-10-CM | POA: Insufficient documentation

## 2016-05-22 DIAGNOSIS — A938 Other specified arthropod-borne viral fevers: Secondary | ICD-10-CM | POA: Insufficient documentation

## 2016-05-22 DIAGNOSIS — Z9189 Other specified personal risk factors, not elsewhere classified: Secondary | ICD-10-CM

## 2016-05-22 DIAGNOSIS — F329 Major depressive disorder, single episode, unspecified: Secondary | ICD-10-CM | POA: Insufficient documentation

## 2016-05-22 DIAGNOSIS — E876 Hypokalemia: Secondary | ICD-10-CM | POA: Insufficient documentation

## 2016-05-22 DIAGNOSIS — R112 Nausea with vomiting, unspecified: Secondary | ICD-10-CM

## 2016-05-22 LAB — URINALYSIS COMPLETE WITH MICROSCOPIC (ARMC ONLY)
Bilirubin Urine: NEGATIVE
Glucose, UA: NEGATIVE mg/dL
HGB URINE DIPSTICK: NEGATIVE
Leukocytes, UA: NEGATIVE
NITRITE: NEGATIVE
PH: 6 (ref 5.0–8.0)
PROTEIN: 100 mg/dL — AB
SPECIFIC GRAVITY, URINE: 1.03 (ref 1.005–1.030)
Squamous Epithelial / LPF: NONE SEEN

## 2016-05-22 LAB — LIPASE, BLOOD: LIPASE: 23 U/L (ref 11–51)

## 2016-05-22 LAB — COMPREHENSIVE METABOLIC PANEL
ALT: 15 U/L — ABNORMAL LOW (ref 17–63)
ANION GAP: 11 (ref 5–15)
AST: 23 U/L (ref 15–41)
Albumin: 4.2 g/dL (ref 3.5–5.0)
Alkaline Phosphatase: 81 U/L (ref 38–126)
BILIRUBIN TOTAL: 0.6 mg/dL (ref 0.3–1.2)
BUN: 11 mg/dL (ref 6–20)
CHLORIDE: 100 mmol/L — AB (ref 101–111)
CO2: 24 mmol/L (ref 22–32)
Calcium: 9.3 mg/dL (ref 8.9–10.3)
Creatinine, Ser: 0.94 mg/dL (ref 0.61–1.24)
Glucose, Bld: 112 mg/dL — ABNORMAL HIGH (ref 65–99)
POTASSIUM: 3 mmol/L — AB (ref 3.5–5.1)
Sodium: 135 mmol/L (ref 135–145)
TOTAL PROTEIN: 7.2 g/dL (ref 6.5–8.1)

## 2016-05-22 LAB — CBC
HEMATOCRIT: 36.7 % — AB (ref 40.0–52.0)
HEMOGLOBIN: 11.8 g/dL — AB (ref 13.0–18.0)
MCH: 28.9 pg (ref 26.0–34.0)
MCHC: 32 g/dL (ref 32.0–36.0)
MCV: 90 fL (ref 80.0–100.0)
Platelets: 114 10*3/uL — ABNORMAL LOW (ref 150–440)
RBC: 4.08 MIL/uL — AB (ref 4.40–5.90)
RDW: 14.3 % (ref 11.5–14.5)
WBC: 12 10*3/uL — AB (ref 3.8–10.6)

## 2016-05-22 MED ORDER — SULFAMETHOXAZOLE-TRIMETHOPRIM 800-160 MG PO TABS: 1.0000 | ORAL_TABLET | Freq: Two times a day (BID) | ORAL | Status: AC

## 2016-05-22 MED ORDER — DOXYCYCLINE HYCLATE 100 MG PO TABS
100.0000 mg | ORAL_TABLET | Freq: Once | ORAL | Status: AC
  Administered 2016-05-22: 100 mg via ORAL
  Filled 2016-05-22: qty 1

## 2016-05-22 MED ORDER — DOXYCYCLINE HYCLATE 50 MG PO CAPS: 100.0000 mg | ORAL_CAPSULE | Freq: Two times a day (BID) | ORAL | Status: AC

## 2016-05-22 MED ORDER — ONDANSETRON 4 MG PO TBDP
4.0000 mg | ORAL_TABLET | Freq: Once | ORAL | Status: AC | PRN
  Administered 2016-05-22: 4 mg via ORAL

## 2016-05-22 MED ORDER — ONDANSETRON 4 MG PO TBDP
ORAL_TABLET | ORAL | Status: AC
  Administered 2016-05-22: 4 mg via ORAL
  Filled 2016-05-22: qty 1

## 2016-05-22 MED ORDER — POTASSIUM CHLORIDE CRYS ER 20 MEQ PO TBCR
40.0000 meq | EXTENDED_RELEASE_TABLET | Freq: Once | ORAL | Status: AC
  Administered 2016-05-22: 40 meq via ORAL
  Filled 2016-05-22: qty 2

## 2016-05-22 MED ORDER — SODIUM CHLORIDE 0.9 % IV BOLUS (SEPSIS)
1000.0000 mL | Freq: Once | INTRAVENOUS | Status: AC
  Administered 2016-05-22: 1000 mL via INTRAVENOUS

## 2016-05-22 MED ORDER — ONDANSETRON HCL 4 MG PO TABS: 4.0000 mg | ORAL_TABLET | Freq: Three times a day (TID) | ORAL | Status: AC | PRN

## 2016-05-22 NOTE — ED Notes (Signed)
Pt from home with n/v/d since Monday; states he had a tick bite 3-4 weeks ago, ran fever 4-5 days after that. Pt states he had fever of 102.6 this morning and took tylenol. Pt alert & oriented with NAD noted.

## 2016-05-22 NOTE — Discharge Instructions (Signed)
You were evaluated for fever, vomiting and diarrhea, after a recent tick bite, and as wediscussed, I think the most likely scenario is a viral gastrointestinal illness, however given the tick bite I will go ahead and treat with doxycycline for possible tickborne illness.  Additionally, your urine test shows urinary tract infection which requires a separate antibiotic. A culture was sent and you would be called if there are any resistance that requires change in antibiotics.  If you continue to have diarrhea, please collect a sample and bring it to the lab at the [facility] for follow-up.  Return to the emergency room for any worsening condition including dizziness or passing out, chest pain or trouble breathing, confusion or altered mental status, abdominal pain, black or bloody stools.   Diarrhea Diarrhea is frequent loose and watery bowel movements. It can cause you to feel weak and dehydrated. Dehydration can cause you to become tired and thirsty, have a dry mouth, and have decreased urination that often is dark yellow. Diarrhea is a sign of another problem, most often an infection that will not last long. In most cases, diarrhea typically lasts 2-3 days. However, it can last longer if it is a sign of something more serious. It is important to treat your diarrhea as directed by your caregiver to lessen or prevent future episodes of diarrhea. CAUSES  Some common causes include:  Gastrointestinal infections caused by viruses, bacteria, or parasites.  Food poisoning or food allergies.  Certain medicines, such as antibiotics, chemotherapy, and laxatives.  Artificial sweeteners and fructose.  Digestive disorders. HOME CARE INSTRUCTIONS  Ensure adequate fluid intake (hydration): Have 1 cup (8 oz) of fluid for each diarrhea episode. Avoid fluids that contain simple sugars or sports drinks, fruit juices, whole milk products, and sodas. Your urine should be clear or pale yellow if you are  drinking enough fluids. Hydrate with an oral rehydration solution that you can purchase at pharmacies, retail stores, and online. You can prepare an oral rehydration solution at home by mixing the following ingredients together:   - tsp table salt.   tsp baking soda.   tsp salt substitute containing potassium chloride.  1  tablespoons sugar.  1 L (34 oz) of water.  Certain foods and beverages may increase the speed at which food moves through the gastrointestinal (GI) tract. These foods and beverages should be avoided and include:  Caffeinated and alcoholic beverages.  High-fiber foods, such as raw fruits and vegetables, nuts, seeds, and whole grain breads and cereals.  Foods and beverages sweetened with sugar alcohols, such as xylitol, sorbitol, and mannitol.  Some foods may be well tolerated and may help thicken stool including:  Starchy foods, such as rice, toast, pasta, low-sugar cereal, oatmeal, grits, baked potatoes, crackers, and bagels.  Bananas.  Applesauce.  Add probiotic-rich foods to help increase healthy bacteria in the GI tract, such as yogurt and fermented milk products.  Wash your hands well after each diarrhea episode.  Only take over-the-counter or prescription medicines as directed by your caregiver.  Take a warm bath to relieve any burning or pain from frequent diarrhea episodes. SEEK IMMEDIATE MEDICAL CARE IF:   You are unable to keep fluids down.  You have persistent vomiting.  You have blood in your stool, or your stools are black and tarry.  You do not urinate in 6-8 hours, or there is only a small amount of very dark urine.  You have abdominal pain that increases or localizes.  You have weakness,  dizziness, confusion, or light-headedness.  You have a severe headache.  Your diarrhea gets worse or does not get better.  You have a fever or persistent symptoms for more than 2-3 days.  You have a fever and your symptoms suddenly get  worse. MAKE SURE YOU:   Understand these instructions.  Will watch your condition.  Will get help right away if you are not doing well or get worse.   This information is not intended to replace advice given to you by your health care provider. Make sure you discuss any questions you have with your health care provider.   Document Released: 12/06/2002 Document Revised: 01/06/2015 Document Reviewed: 08/23/2012 Elsevier Interactive Patient Education 2016 [employer].  Hypokalemia Hypokalemia means that the amount of potassium in the blood is lower than normal.Potassium is a chemical, called an electrolyte, that helps regulate the amount of fluid in the body. It also stimulates muscle contraction and helps nerves function properly.Most of the body's potassium is inside of cells, and only a very small amount is in the blood. Because the amount in the blood is so small, minor changes can be life-threatening. CAUSES  Antibiotics.  Diarrhea or vomiting.  Using laxatives too much, which can cause diarrhea.  Chronic kidney disease.  Water pills (diuretics).  Eating disorders (bulimia).  Low magnesium level.  Sweating a lot. SIGNS AND SYMPTOMS  Weakness.  Constipation.  Fatigue.  Muscle cramps.  Mental confusion.  Skipped heartbeats or irregular heartbeat (palpitations).  Tingling or numbness. DIAGNOSIS  Your health care provider can diagnose hypokalemia with blood tests. In addition to checking your potassium level, your health care provider may also check other lab tests. TREATMENT Hypokalemia can be treated with potassium supplements taken by mouth or adjustments in your current medicines. If your potassium level is very low, you may need to get potassium through a vein (IV) and be monitored in the hospital. A diet high in potassium is also helpful. Foods high in potassium are:  Nuts, such as peanuts and pistachios.  Seeds, such as sunflower seeds and pumpkin  seeds.  Peas, lentils, and lima beans.  Whole grain and bran cereals and breads.  Fresh fruit and vegetables, such as apricots, avocado, bananas, cantaloupe, kiwi, oranges, tomatoes, asparagus, and potatoes.  Orange and tomato juices.  Red meats.  Fruit yogurt. HOME CARE INSTRUCTIONS  Take all medicines as prescribed by your health care provider.  Maintain a healthy diet by including nutritious food, such as fruits, vegetables, nuts, whole grains, and lean meats.  If you are taking a laxative, be sure to follow the directions on the label. SEEK MEDICAL CARE IF:  Your weakness gets worse.  You feel your heart pounding or racing.  You are vomiting or having diarrhea.  You are diabetic and having trouble keeping your blood glucose in the normal range. SEEK IMMEDIATE MEDICAL CARE IF:  You have chest pain, shortness of breath, or dizziness.  You are vomiting or having diarrhea for more than 2 days.  You faint. MAKE SURE YOU:   Understand these instructions.  Will watch your condition.  Will get help right away if you are not doing well or get worse.   This information is not intended to replace advice given to you by your health care provider. Make sure you discuss any questions you have with your health care provider.   Document Released: 12/16/2005 Document Revised: 01/06/2015 Document Reviewed: 06/18/2013 Elsevier Interactive Patient Education 2016 Elsevier Inc.  Nausea and Vomiting Nausea means  you feel sick to your stomach. Throwing up (vomiting) is a reflex where stomach contents come out of your mouth. HOME CARE   Take medicine as told by your doctor.  Do not force yourself to eat. However, you do need to drink fluids.  If you feel like eating, eat a normal diet as told by your doctor.  Eat rice, wheat, potatoes, bread, lean meats, yogurt, fruits, and vegetables.  Avoid high-fat foods.  Drink enough fluids to keep your pee (urine) clear or pale  yellow.  Ask your doctor how to replace body fluid losses (rehydrate). Signs of body fluid loss (dehydration) include:  Feeling very thirsty.  Dry lips and mouth.  Feeling dizzy.  Dark pee.  Peeing less than normal.  Feeling confused.  Fast breathing or heart rate. GET HELP RIGHT AWAY IF:   You have blood in your throw up.  You have black or bloody poop (stool).  You have a bad headache or stiff neck.  You feel confused.  You have bad belly (abdominal) pain.  You have chest pain or trouble breathing.  You do not pee at least once every 8 hours.  You have cold, clammy skin.  You keep throwing up after 24 to 48 hours.  You have a fever. MAKE SURE YOU:   Understand these instructions.  Will watch your condition.  Will get help right away if you are not doing well or get worse.   This information is not intended to replace advice given to you by your health care provider. Make sure you discuss any questions you have with your health care provider.   Document Released: 06/03/2008 Document Revised: 03/09/2012 Document Reviewed: 05/17/2011 Elsevier Interactive Patient Education 2016 [employer].  Syncope Syncope means a person passes out (faints). The person usually wakes up in less than 5 minutes. It is important to seek medical care for syncope. HOME CARE  Have someone stay with you until you feel normal.  Do not drive, use machines, or play sports until your doctor says it is okay.  Keep all doctor visits as told.  Lie down when you feel like you might pass out. Take deep breaths. Wait until you feel normal before standing up.  Drink enough fluids to keep your pee (urine) clear or pale yellow.  If you take blood pressure or heart medicine, get up slowly. Take several minutes to sit and then stand. GET HELP RIGHT AWAY IF:   You have a severe headache.  You have pain in the chest, belly (abdomen), or back.  You are bleeding from the mouth or butt  (rectum).  You have black or tarry poop (stool).  You have an irregular or very fast heartbeat.  You have pain with breathing.  You keep passing out, or you have shaking (seizures) when you pass out.  You pass out when sitting or lying down.  You feel confused.  You have trouble walking.  You have severe weakness.  You have vision problems. If you fainted, call for help (911 in [place] Do not drive yourself to the hospital.   This information is not intended to replace advice given to you by your health care provider. Make sure you discuss any questions you have with your health care provider.   Document Released: 06/03/2008 Document Revised: 05/02/2015 Document Reviewed: 02/14/2012 Elsevier Interactive Patient Education 2016 Elsevier Inc.  Urinary Tract Infection Urinary tract infections (UTIs) can develop anywhere along your urinary tract. Your urinary tract is your body's drainage  system for removing wastes and extra water. Your urinary tract includes two kidneys, two ureters, a bladder, and a urethra. Your kidneys are a pair of bean-shaped organs. Each kidney is about the size of your fist. They are located below your ribs, one on each side of your spine. CAUSES Infections are caused by microbes, which are microscopic organisms, including fungi, viruses, and bacteria. These organisms are so small that they can only be seen through a microscope. Bacteria are the microbes that most commonly cause UTIs. SYMPTOMS  Symptoms of UTIs may vary by age and gender of the patient and by the location of the infection. Symptoms in young women typically include a frequent and intense urge to urinate and a painful, burning feeling in the bladder or urethra during urination. Older women and men are more likely to be tired, shaky, and weak and have muscle aches and abdominal pain. A fever may mean the infection is in your kidneys. Other symptoms of a kidney infection include pain in your back or  sides below the ribs, nausea, and vomiting. DIAGNOSIS To diagnose a UTI, your caregiver will ask you about your symptoms. Your caregiver will also ask you to provide a urine sample. The urine sample will be tested for bacteria and white blood cells. White blood cells are made by your body to help fight infection. TREATMENT  Typically, UTIs can be treated with medication. Because most UTIs are caused by a bacterial infection, they usually can be treated with the use of antibiotics. The choice of antibiotic and length of treatment depend on your symptoms and the type of bacteria causing your infection. HOME CARE INSTRUCTIONS  If you were prescribed antibiotics, take them exactly as your caregiver instructs you. Finish the medication even if you feel better after you have only taken some of the medication.  Drink enough water and fluids to keep your urine clear or pale yellow.  Avoid caffeine, tea, and carbonated beverages. They tend to irritate your bladder.  Empty your bladder often. Avoid holding urine for long periods of time.  Empty your bladder before and after sexual intercourse.  After a bowel movement, women should cleanse from front to back. Use each tissue only once. SEEK MEDICAL CARE IF:   You have back pain.  You develop a fever.  Your symptoms do not begin to resolve within 3 days. SEEK IMMEDIATE MEDICAL CARE IF:   You have severe back pain or lower abdominal pain.  You develop chills.  You have nausea or vomiting.  You have continued burning or discomfort with urination. MAKE SURE YOU:   Understand these instructions.  Will watch your condition.  Will get help right away if you are not doing well or get worse.   This information is not intended to replace advice given to you by your health care provider. Make sure you discuss any questions you have with your health care provider.   Document Released: 09/25/2005 Document Revised: 09/06/2015 Document Reviewed:  01/24/2012 Elsevier Interactive Patient Education [employer]2016 Elsevier Inc.

## 2016-05-22 NOTE — ED Provider Notes (Signed)
[facility] Emergency Department Provider Note   ____________________________________________  Time seen: Approximately 1:30 PM I have reviewed the triage vital signs and the triage nursing note.  HISTORY  Chief Complaint Emesis; Diarrhea; and Weakness   Historian Patient  HPI Alexis West is a 40 y.o. male states she's had a fever intermittently, most recently since Monday when this is been associated with nausea vomiting and diarrhea. No bloody emesis and no black or bloody stools. Watery diarrhea. Reports no significant risk factors of recent antibiotic use, travel history, or bad food. He does give a history that several weeks ago he was bitten by a tick and did have a fever a few days after that, there is no skin rash noted.  This morning he felt lightheaded and dizzy and nearly passed out. There is no chest pain.    Past Medical History  Diagnosis Date  . Panic attack   . Chronic back pain   . Seizures (HCC)     Benzo withdrawal seizure 2004.    Patient Active Problem List   Diagnosis Date Noted  . Depression 09/16/2014  . Anxiety 09/16/2014  . Chest pain 12/20/2013  . Dysphagia, unspecified(787.20) 12/20/2013  . Loss of weight 12/20/2013  . Protein-calorie malnutrition, severe (HCC) 12/20/2013  . Syncope 12/19/2013  . Drug-seeking behavior 12/15/2013  . Hemoptysis, unspecified 12/07/2013  . Cough 12/07/2013  . Pulmonary nodules 12/07/2013  . Smoker 10/22/2013  . Anxiety state, unspecified 10/22/2013    Past Surgical History  Procedure Laterality Date  . Video bronchoscopy Bilateral 12/15/2013    Procedure: VIDEO BRONCHOSCOPY WITHOUT FLUORO;  Surgeon: Lupita Leash, MD;  Location: [facility] ENDOSCOPY;  Service: Cardiopulmonary;  Laterality: Bilateral;  . Esophagogastroduodenoscopy N/A 12/21/2013    Procedure: ESOPHAGOGASTRODUODENOSCOPY (EGD);  Surgeon: Theda Belfast, MD;  Location: [facility] ENDOSCOPY;  Service: Endoscopy;  Laterality:  N/A;    Current Outpatient Rx  Name  Route  Sig  Dispense  Refill  . doxycycline (VIBRAMYCIN) 50 MG capsule   Oral   Take 2 capsules (100 mg total) by mouth 2 (two) times daily.   40 capsule   0   . hydrOXYzine (ATARAX/VISTARIL) 25 MG tablet   Oral   Take 1 tablet (25 mg total) by mouth every 6 (six) hours as needed for anxiety.   30 tablet   0   . ondansetron (ZOFRAN) 4 MG tablet   Oral   Take 1 tablet (4 mg total) by mouth every 8 (eight) hours as needed for nausea or vomiting.   10 tablet   0   . sulfamethoxazole-trimethoprim (BACTRIM DS,SEPTRA DS) 800-160 MG tablet   Oral   Take 1 tablet by mouth 2 (two) times daily.   13 tablet   0   . traZODone (DESYREL) 100 MG tablet   Oral   Take 2 tablets (200 mg total) by mouth at bedtime.           Allergies Penicillins and Morphine and related  Family History  Problem Relation Age of Onset  . Hypertension Father     Social History Social History  Substance Use Topics  . Smoking status: Current Every Day Smoker -- 1.00 packs/day    Types: Cigarettes    Start date: 12/30/1988  . Smokeless tobacco: Never Used     Comment: Pt has smoked on and off since he was 13.  Pt has this time been smoking since 08/2012  . Alcohol Use: No    Review of Systems  Constitutional: Positive for fever. Eyes: Negative for visual changes. ENT: Negative for sore throat. Cardiovascular: Negative for chest pain. Respiratory: Negative for shortness of breath. Negative for coughing Gastrointestinal: Negative for any significant abdominal pain.. Genitourinary: Negative for dysuria. Musculoskeletal: Negative for back pain. Skin: Negative for rash. Neurological: Negative for headache. 10 point Review of Systems otherwise negative ____________________________________________   PHYSICAL EXAM:  VITAL SIGNS: ED Triage Vitals  Enc Vitals Group     BP 05/22/16 1037 118/82 mmHg     Pulse Rate 05/22/16 1037 123     Resp 05/22/16 1037  20     Temp 05/22/16 1037 99.4 F (37.4 C)     Temp src --      SpO2 05/22/16 1037 98 %     Weight 05/22/16 1037 150 lb (68.04 kg)     Height 05/22/16 1037 5\' 11"  (1.803 m)     Head Cir --      Peak Flow --      Pain Score --      Pain Loc --      Pain Edu? --      Excl. in GC? --      Constitutional: Alert and oriented. Well appearing and in no distress. HEENT   Head: Normocephalic and atraumatic.      Eyes: Conjunctivae are normal. PERRL. Normal extraocular movements.      Ears:         Nose: No congestion/rhinnorhea.   Mouth/Throat: Mucous membranes are Moist.   Neck: No stridor. Cardiovascular/Chest: Normal rate, regular rhythm.  No murmurs, rubs, or gallops. Respiratory: Normal respiratory effort without tachypnea nor retractions. Breath sounds are clear and equal bilaterally. No wheezes/rales/rhonchi. Gastrointestinal: Soft. No distention, no guarding, no rebound. Nontender.    Genitourinary/rectal:Deferred Musculoskeletal: Nontender with normal range of motion in all extremities. No joint effusions.  No lower extremity tenderness.  No edema. Neurologic:  Normal speech and language. No gross or focal neurologic deficits are appreciated. Skin:  Skin is warm, dry and intact. No rash noted. Psychiatric: Mood and affect are normal. Speech and behavior are normal. Patient exhibits appropriate insight and judgment.  ____________________________________________   EKG I, Governor Rooksebecca Jordynn Perrier, MD, the attending physician have personally viewed and interpreted all ECGs.  90 bpm. Normal sinus rhythm. Narrow QRS. Normal axis. Nonspecific T-wave ____________________________________________  LABS (pertinent positives/negatives)  Labs Reviewed  COMPREHENSIVE METABOLIC PANEL - Abnormal; Notable for the following:    Potassium 3.0 (*)    Chloride 100 (*)    Glucose, Bld 112 (*)    ALT 15 (*)    All other components within normal limits  CBC - Abnormal; Notable for the  following:    WBC 12.0 (*)    RBC 4.08 (*)    Hemoglobin 11.8 (*)    HCT 36.7 (*)    Platelets 114 (*)    All other components within normal limits  URINALYSIS COMPLETEWITH MICROSCOPIC ([facility] ONLY) - Abnormal; Notable for the following:    Color, Urine AMBER (*)    APPearance HAZY (*)    Ketones, ur TRACE (*)    Protein, ur 100 (*)    Bacteria, UA RARE (*)    All other components within normal limits  LIPASE, BLOOD    ____________________________________________  RADIOLOGY All Xrays were viewed by me. Imaging interpreted by Radiologist.  None __________________________________________  PROCEDURES  Procedure(s) performed: None  Critical Care performed: None  ____________________________________________   ED COURSE / ASSESSMENT AND PLAN  Pertinent labs &  imaging results that were available during my care of the patient were reviewed by me and considered in my medical decision making (see chart for details).   This patient received IV Zofran and IV fluids in some weight area by protocol prior to me seeing this patient. He is overall well-appearing with a white blood cell count of 12.0, hypokalemic to 3.0 and hemoglobin 11.8 with a prior hemoglobin from a several years ago which was normal at that time around 13.  He is denying black or bloody stools or bloody emesis. Although it sounds mostly like this recent illness may be a viral gastroenteritis, given the tick bite and the fevers close in proximity and now recurrent, I will treat him with doxycycline to cover for tickborne illness.  He describes a near syncope episode today without any concerning features in terms of cardio or pulmonary causes. EKG reassuring.   Orthostatics negative after IV fluids. His urinalysis is consistent with urinary tract infection and I will cover him with Bactrim, he has a reported allergy to penicillins. A urine culture was sent. No hypotension, and appears well, I'm not concerned about  sepsis at this point.  Patient will be discharged home on antibiotics for born illness and urinary tract infection.    CONSULTATIONS:  None  Patient / Family / Caregiver informed of clinical course, medical decision-making process, and agree with plan.   I discussed return precautions, follow-up instructions, and discharged instructions with patient and/or family.  In completing this note I recognized that I had not ordered a troponin, Like I might have done by protocol for complaint of near syncope.  I thought I might consider to go ahead and add this on, although patient discharged.   However, there was not enough blood for the test.  He had no chest complaints, and his near syncope seem orthostatic related to fluid losses during GI illness.   I don't think it's really necessary to call back for this. ___________________________________________   FINAL CLINICAL IMPRESSION(S) / ED DIAGNOSES   Final diagnoses:  Nausea vomiting and diarrhea  Hypokalemia  At high risk for tick borne illness  Near syncope  UTI (lower urinary tract infection)              Note: This dictation was prepared with Dragon dictation. Any transcriptional errors that result from this process are unintentional   Governor Rooks, MD 05/22/16 [phone]

## 2017-07-01 ENCOUNTER — Encounter: Payer: Self-pay | Admitting: *Deleted

## 2017-07-01 ENCOUNTER — Emergency Department
Admission: EM | Admit: 2017-07-01 | Discharge: 2017-07-01 | Disposition: A | Discharge status: Home / Self Care | Payer: Self-pay | Attending: Emergency Medicine | Admitting: Emergency Medicine

## 2017-07-01 DIAGNOSIS — T675XXA Heat exhaustion, unspecified, initial encounter: Secondary | ICD-10-CM

## 2017-07-01 DIAGNOSIS — R1110 Vomiting, unspecified: Secondary | ICD-10-CM | POA: Insufficient documentation

## 2017-07-01 DIAGNOSIS — F17210 Nicotine dependence, cigarettes, uncomplicated: Secondary | ICD-10-CM | POA: Insufficient documentation

## 2017-07-01 LAB — CK: Total CK: 191 U/L (ref 49–397)

## 2017-07-01 LAB — COMPREHENSIVE METABOLIC PANEL
ALBUMIN: 4.6 g/dL (ref 3.5–5.0)
ALK PHOS: 86 U/L (ref 38–126)
ALT: 16 U/L — AB (ref 17–63)
AST: 28 U/L (ref 15–41)
Anion gap: 9 (ref 5–15)
BILIRUBIN TOTAL: 0.6 mg/dL (ref 0.3–1.2)
BUN: 27 mg/dL — ABNORMAL HIGH (ref 6–20)
CALCIUM: 9.7 mg/dL (ref 8.9–10.3)
CO2: 29 mmol/L (ref 22–32)
CREATININE: 1.18 mg/dL (ref 0.61–1.24)
Chloride: 98 mmol/L — ABNORMAL LOW (ref 101–111)
GFR calc Af Amer: >60 mL/min (ref >60)
GLUCOSE: 129 mg/dL — AB (ref 65–99)
Potassium: 4.4 mmol/L (ref 3.5–5.1)
Sodium: 136 mmol/L (ref 135–145)
TOTAL PROTEIN: 7.8 g/dL (ref 6.5–8.1)

## 2017-07-01 LAB — CBC
HCT: 47.2 % (ref 40.0–52.0)
Hemoglobin: 15.7 g/dL (ref 13.0–18.0)
MCH: 28.9 pg (ref 26.0–34.0)
MCHC: 33.3 g/dL (ref 32.0–36.0)
MCV: 86.6 fL (ref 80.0–100.0)
PLATELETS: 288 10*3/uL (ref 150–440)
RBC: 5.44 MIL/uL (ref 4.40–5.90)
RDW: 13.7 % (ref 11.5–14.5)
WBC: 12 10*3/uL — AB (ref 3.8–10.6)

## 2017-07-01 LAB — LIPASE, BLOOD: Lipase: 32 U/L (ref 11–51)

## 2017-07-01 MED ORDER — ONDANSETRON 4 MG PO TBDP
4.0000 mg | ORAL_TABLET | Freq: Once | ORAL | Status: AC
  Administered 2017-07-01: 4 mg via ORAL
  Filled 2017-07-01: qty 1

## 2017-07-01 MED ORDER — ONDANSETRON 4 MG PO TBDP: 4.0000 mg | ORAL_TABLET | Freq: Three times a day (TID) | ORAL | 0 refills | Status: AC | PRN

## 2017-07-01 NOTE — ED Provider Notes (Signed)
[facility] Emergency Department Provider Note       Time seen: ----------------------------------------- 6:13 PM on 07/01/2017 -----------------------------------------     I have reviewed the triage vital signs and the nursing notes.   HISTORY   Chief Complaint Nausea and Emesis    HPI Alexis West is a [age] y.o. male who presents to the ED for nausea, vomiting and feeling like he may have gotten overheated. 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 He states he had significant amount of sweating yesterday, felt fatigued afterwards and continued to feel somewhat that way today. Patient is convinced he got overheated but is able to keep liquids and food down now.   Past Medical History:  Diagnosis Date  . Chronic back pain   . Panic attack   . Seizures (HCC)    Benzo withdrawal seizure 2004.    Patient Active Problem List   Diagnosis Date Noted  . Depression 09/16/2014  . Anxiety 09/16/2014  . Chest pain 12/20/2013  . Dysphagia, unspecified(787.20) 12/20/2013  . Loss of weight 12/20/2013  . Protein-calorie malnutrition, severe (HCC) 12/20/2013  . Syncope 12/19/2013  . Drug-seeking behavior 12/15/2013  . Hemoptysis, unspecified 12/07/2013  . Cough 12/07/2013  . Pulmonary nodules 12/07/2013  . Smoker 10/22/2013  . Anxiety state, unspecified 10/22/2013    Past Surgical History:  Procedure Laterality Date  . ESOPHAGOGASTRODUODENOSCOPY N/A 12/21/2013   Procedure: ESOPHAGOGASTRODUODENOSCOPY (EGD);  Surgeon: Theda BelfastPatrick D Hung, MD;  [facility];  Service: Endoscopy;  Laterality: N/A;  . VIDEO BRONCHOSCOPY Bilateral 12/15/2013   Procedure: VIDEO BRONCHOSCOPY WITHOUT FLUORO;  Surgeon: Lupita Leashouglas B McQuaid, MD;  Location: [facility] ENDOSCOPY;  Service: Cardiopulmonary;  Laterality: Bilateral;    Allergies Penicillins and Morphine and related  Social History Social History  Substance Use Topics   . Smoking status: Current Every Day Smoker    Packs/day: 1.00    Types: Cigarettes    Start date: 12/30/1988  . Smokeless tobacco: Never Used     Comment: Pt has smoked on and off since he was 13.  Pt has this time been smoking since 08/2012  . Alcohol use No    Review of Systems Constitutional: Negative for fever. Eyes: Negative for vision changes ENT:  Negative for congestion, sore throat Cardiovascular: Negative for chest pain. Respiratory: Negative for shortness of breath. Gastrointestinal: Negative for abdominal pain, positive for recent nausea and vomiting Genitourinary: Negative for dysuria. Musculoskeletal: Negative for back pain. Skin: Negative for rash. Neurological: Negative for headaches, focal weakness or numbness.  All systems negative/normal/unremarkable except as stated in the HPI  ____________________________________________   PHYSICAL EXAM:  VITAL SIGNS: ED Triage Vitals  Enc Vitals Group     BP 07/01/17 1750 (!) 140/96     Pulse Rate 07/01/17 1749 (!) 106     Resp 07/01/17 1749 18     Temp 07/01/17 1749 99 F (37.2 C)     Temp Source 07/01/17 1749 Oral     SpO2 07/01/17 1749 100 %     Weight 07/01/17 1750 155 lb (70.3 kg)     Height 07/01/17 1750 5\' 11"  (1.803 m)     Head Circumference --      Peak Flow --      Pain Score --      Pain Loc --      Pain Edu? --      Excl. in GC? --     Constitutional: Alert and oriented. Well  appearing and in no distress. Eyes: Conjunctivae are normal. Normal extraocular movements. ENT   Head: Normocephalic and atraumatic.   Nose: No congestion/rhinnorhea.   Mouth/Throat: Mucous membranes are moist.   Neck: No stridor. Cardiovascular: Normal rate, regular rhythm. No murmurs, rubs, or gallops. Respiratory: Normal respiratory effort without tachypnea nor retractions. Breath sounds are clear and equal bilaterally. No wheezes/rales/rhonchi. Gastrointestinal: Soft and nontender. Normal bowel  sounds Musculoskeletal: Nontender with normal range of motion in extremities. No lower extremity tenderness nor edema. Neurologic:  Normal speech and language. No gross focal neurologic deficits are appreciated.  Skin:  Skin is warm, dry and intact. No rash noted. Psychiatric: Mood and affect are normal. Speech and behavior are normal.  ____________________________________________  ED COURSE:  Pertinent labs & imaging results that were available during my care of the patient were reviewed by me and considered in my medical decision making (see chart for details). Patient presents for he related illness, we will assess with labs as indicated.   Procedures ____________________________________________   LABS (pertinent positives/negatives)  Labs Reviewed  CBC - Abnormal; Notable for the following:       Result Value   WBC 12.0 (*)    All other components within normal limits  LIPASE, BLOOD  COMPREHENSIVE METABOLIC PANEL  URINALYSIS, COMPLETE (UACMP) WITH MICROSCOPIC  CK   ____________________________________________  FINAL ASSESSMENT AND PLAN  Heat exhaustion  Plan: Patient's labs were dictated above. Patient had presented for symptoms. Exhaustion. Patient had preferred that he not receive an IV. He was able to orally rehydrate and labs are reassuring. He is stable for outpatient follow-up.   Emily Filbert, MD   Note: This note was generated in part or whole with voice recognition software. Voice recognition is usually quite accurate but there are transcription errors that can and very often do occur. I apologize for any typographical errors that were not detected and corrected.     Emily Filbert, MD 07/01/17 Rickey Primus

## 2017-07-01 NOTE — ED Notes (Signed)
Pt unable to void at this time. 

## 2017-07-01 NOTE — ED Triage Notes (Signed)
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  Pt reports nausea and vomiting today.  No abd pain.   Pt alert.

## 2019-10-06 ENCOUNTER — Encounter: Payer: Self-pay | Admitting: Emergency Medicine

## 2019-10-06 ENCOUNTER — Emergency Department: Payer: No Typology Code available for payment source

## 2019-10-06 ENCOUNTER — Other Ambulatory Visit: Payer: Self-pay

## 2019-10-06 ENCOUNTER — Emergency Department
Admission: EM | Admit: 2019-10-06 | Discharge: 2019-10-06 | Disposition: A | Discharge status: Home / Self Care | Payer: No Typology Code available for payment source | Attending: Emergency Medicine | Admitting: Emergency Medicine

## 2019-10-06 DIAGNOSIS — Y9389 Activity, other specified: Secondary | ICD-10-CM | POA: Diagnosis not present

## 2019-10-06 DIAGNOSIS — S0990XA Unspecified injury of head, initial encounter: Secondary | ICD-10-CM | POA: Diagnosis present

## 2019-10-06 DIAGNOSIS — Z79899 Other long term (current) drug therapy: Secondary | ICD-10-CM | POA: Insufficient documentation

## 2019-10-06 DIAGNOSIS — S0083XA Contusion of other part of head, initial encounter: Secondary | ICD-10-CM | POA: Diagnosis not present

## 2019-10-06 DIAGNOSIS — Y999 Unspecified external cause status: Secondary | ICD-10-CM | POA: Diagnosis not present

## 2019-10-06 DIAGNOSIS — M7918 Myalgia, other site: Secondary | ICD-10-CM

## 2019-10-06 DIAGNOSIS — F17210 Nicotine dependence, cigarettes, uncomplicated: Secondary | ICD-10-CM | POA: Diagnosis not present

## 2019-10-06 DIAGNOSIS — Y92410 Unspecified street and highway as the place of occurrence of the external cause: Secondary | ICD-10-CM | POA: Insufficient documentation

## 2019-10-06 LAB — URINALYSIS, COMPLETE (UACMP) WITH MICROSCOPIC
Bacteria, UA: NONE SEEN
Bilirubin Urine: NEGATIVE
Glucose, UA: NEGATIVE mg/dL
Hgb urine dipstick: NEGATIVE
Ketones, ur: NEGATIVE mg/dL
Leukocytes,Ua: NEGATIVE
Nitrite: NEGATIVE
Protein, ur: NEGATIVE mg/dL
Specific Gravity, Urine: 1.027 (ref 1.005–1.030)
Squamous Epithelial / LPF: NONE SEEN (ref 0–5)
pH: 5 (ref 5.0–8.0)

## 2019-10-06 MED ORDER — BACLOFEN 10 MG PO TABS: 10.0000 mg | ORAL_TABLET | Freq: Three times a day (TID) | ORAL | 0 refills | Status: AC

## 2019-10-06 MED ORDER — TRAMADOL HCL 50 MG PO TABS: 50.0000 mg | ORAL_TABLET | Freq: Four times a day (QID) | ORAL | 0 refills | Status: AC | PRN

## 2019-10-06 MED ORDER — OXYCODONE-ACETAMINOPHEN 5-325 MG PO TABS
1.0000 | ORAL_TABLET | Freq: Once | ORAL | Status: AC
  Administered 2019-10-06: 1 via ORAL
  Filled 2019-10-06: qty 1

## 2019-10-06 MED ORDER — MELOXICAM 15 MG PO TABS: 15.0000 mg | ORAL_TABLET | Freq: Every day | ORAL | 2 refills | Status: AC

## 2019-10-06 NOTE — Discharge Instructions (Signed)
Follow-up with podiatry clinic orthopedics if not better in 4 to 5 days.  Apply ice to the bony areas that hurt.  Wet heat to the muscular areas that hurt.  Use the medication as prescribed.  Return if worsening.
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
## 2019-10-06 NOTE — ED Notes (Signed)
Patient transported to CT 
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
## 2022-09-14 DIAGNOSIS — J0100 Acute maxillary sinusitis, unspecified: Secondary | ICD-10-CM | POA: Diagnosis not present

## 2022-09-14 DIAGNOSIS — Z6824 Body mass index (BMI) 24.0-24.9, adult: Secondary | ICD-10-CM | POA: Diagnosis not present

## 2024-01-19 DIAGNOSIS — R030 Elevated blood-pressure reading, without diagnosis of hypertension: Secondary | ICD-10-CM | POA: Diagnosis not present

## 2024-01-19 DIAGNOSIS — J111 Influenza due to unidentified influenza virus with other respiratory manifestations: Secondary | ICD-10-CM | POA: Diagnosis not present

## 2024-01-19 DIAGNOSIS — M791 Myalgia, unspecified site: Secondary | ICD-10-CM | POA: Diagnosis not present

## 2024-01-19 DIAGNOSIS — Z20822 Contact with and (suspected) exposure to covid-19: Secondary | ICD-10-CM | POA: Diagnosis not present

## 2024-06-10 DIAGNOSIS — J0121 Acute recurrent ethmoidal sinusitis: Secondary | ICD-10-CM | POA: Diagnosis not present
